# Patient Record
Sex: Female | Born: 1965 | Race: Black or African American | Hispanic: No | Marital: Married | Smoking: Never smoker
Health system: Southern US, Community
[De-identification: ages and names within clinical notes are randomized; demographics above are authoritative.]

## PROBLEM LIST (undated history)

## (undated) DIAGNOSIS — R7303 Prediabetes: Secondary | ICD-10-CM

## (undated) DIAGNOSIS — I10 Essential (primary) hypertension: Secondary | ICD-10-CM

## (undated) DIAGNOSIS — G4700 Insomnia, unspecified: Secondary | ICD-10-CM

## (undated) DIAGNOSIS — E785 Hyperlipidemia, unspecified: Secondary | ICD-10-CM

## (undated) DIAGNOSIS — M5441 Lumbago with sciatica, right side: Secondary | ICD-10-CM

## (undated) DIAGNOSIS — G8929 Other chronic pain: Secondary | ICD-10-CM

## (undated) DIAGNOSIS — M5442 Lumbago with sciatica, left side: Secondary | ICD-10-CM

## (undated) DIAGNOSIS — D219 Benign neoplasm of connective and other soft tissue, unspecified: Secondary | ICD-10-CM

## (undated) HISTORY — DX: Prediabetes: R73.03

## (undated) HISTORY — PX: ABDOMINAL SURGERY: SHX537

## (undated) HISTORY — PX: OTHER SURGICAL HISTORY: SHX169

## (undated) HISTORY — DX: Morbid (severe) obesity due to excess calories: E66.01

## (undated) HISTORY — DX: Lumbago with sciatica, right side: M54.41

## (undated) HISTORY — DX: Hyperlipidemia, unspecified: E78.5

## (undated) HISTORY — DX: Insomnia, unspecified: G47.00

## (undated) HISTORY — DX: Other chronic pain: G89.29

## (undated) HISTORY — DX: Lumbago with sciatica, left side: M54.42

## (undated) HISTORY — DX: Benign neoplasm of connective and other soft tissue, unspecified: D21.9

---

## 1998-07-10 ENCOUNTER — Other Ambulatory Visit: Admission: RE | Admit: 1998-07-10 | Discharge: 1998-07-10 | Payer: Self-pay | Admitting: Family Medicine

## 1999-02-18 ENCOUNTER — Inpatient Hospital Stay (HOSPITAL_COMMUNITY): Admission: AD | Admit: 1999-02-18 | Discharge: 1999-02-18 | Payer: Self-pay | Admitting: Obstetrics

## 2016-12-09 ENCOUNTER — Encounter (HOSPITAL_COMMUNITY): Payer: Self-pay | Admitting: Emergency Medicine

## 2016-12-09 ENCOUNTER — Emergency Department (HOSPITAL_COMMUNITY)
Admission: EM | Admit: 2016-12-09 | Discharge: 2016-12-09 | Disposition: A | Discharge status: Home / Self Care | Payer: Medicaid Other | Attending: Emergency Medicine | Admitting: Emergency Medicine

## 2016-12-09 DIAGNOSIS — Z9889 Other specified postprocedural states: Secondary | ICD-10-CM | POA: Insufficient documentation

## 2016-12-09 DIAGNOSIS — Z5189 Encounter for other specified aftercare: Secondary | ICD-10-CM

## 2016-12-09 DIAGNOSIS — G8918 Other acute postprocedural pain: Secondary | ICD-10-CM | POA: Diagnosis not present

## 2016-12-09 DIAGNOSIS — R5082 Postprocedural fever: Secondary | ICD-10-CM | POA: Insufficient documentation

## 2016-12-09 DIAGNOSIS — R109 Unspecified abdominal pain: Secondary | ICD-10-CM | POA: Insufficient documentation

## 2016-12-09 DIAGNOSIS — I10 Essential (primary) hypertension: Secondary | ICD-10-CM | POA: Diagnosis not present

## 2016-12-09 HISTORY — DX: Essential (primary) hypertension: I10

## 2016-12-09 LAB — CBC
HCT: 27.1 % — ABNORMAL LOW (ref 36.0–46.0)
Hemoglobin: 9.4 g/dL — ABNORMAL LOW (ref 12.0–15.0)
MCH: 30.1 pg (ref 26.0–34.0)
MCHC: 34.7 g/dL (ref 30.0–36.0)
MCV: 86.9 fL (ref 78.0–100.0)
PLATELETS: 543 10*3/uL — AB (ref 150–400)
RBC: 3.12 MIL/uL — ABNORMAL LOW (ref 3.87–5.11)
RDW: 15 % (ref 11.5–15.5)
WBC: 12.8 10*3/uL — ABNORMAL HIGH (ref 4.0–10.5)

## 2016-12-09 LAB — URINALYSIS, ROUTINE W REFLEX MICROSCOPIC
Bilirubin Urine: NEGATIVE
GLUCOSE, UA: NEGATIVE mg/dL
KETONES UR: 20 mg/dL — AB
Leukocytes, UA: NEGATIVE
Nitrite: NEGATIVE
PROTEIN: 30 mg/dL — AB
Specific Gravity, Urine: 1.011 (ref 1.005–1.030)
pH: 6 (ref 5.0–8.0)

## 2016-12-09 LAB — BASIC METABOLIC PANEL
ANION GAP: 11 (ref 5–15)
BUN: 7 mg/dL (ref 6–20)
CALCIUM: 8.6 mg/dL — AB (ref 8.9–10.3)
CO2: 24 mmol/L (ref 22–32)
CREATININE: 0.87 mg/dL (ref 0.44–1.00)
Chloride: 95 mmol/L — ABNORMAL LOW (ref 101–111)
GFR calc Af Amer: >60 mL/min (ref >60)
GLUCOSE: 97 mg/dL (ref 65–99)
Potassium: 3.1 mmol/L — ABNORMAL LOW (ref 3.5–5.1)
Sodium: 130 mmol/L — ABNORMAL LOW (ref 135–145)

## 2016-12-09 NOTE — ED Triage Notes (Addendum)
Per pt, states she had tummy tuck on the 1st of December-states started having hematuria and vaginal swelling/discharge 4 days after-saw PCP yesterday and was evaluated-sent home-here for second opinion-placed on Bactrim for UTI

## 2016-12-09 NOTE — ED Notes (Signed)
Unable to obtain vital signs due patient was in a rush to get to appointment with surgeon, which was arranged by MD visit.

## 2016-12-09 NOTE — ED Notes (Signed)
Patient is A & O x4.  She was ambulatory.  Understood discharge instructions.

## 2016-12-09 NOTE — ED Provider Notes (Signed)
[facility] DEPT Provider Note   CSN: [id number] Arrival date & time: 12/09/16  1020     History   Chief Complaint Chief Complaint  Patient presents with  . post op problems    HPI Kelly Combs is a 50 y.o. female.  Patient status post tummy tuck plastic surgery procedure on December 1. Followed up with plastic surgeon September 13. Since that time patient states wounds been getting redder and harder as been increased pain in the area. Also talking about some drainage from the area where the drain tube was. Patient does states she fell and she's had fever and chills on and off for the past several days. Now has blood in her urine. Procedure by Dr. Sherri Sear      Past Medical History:  Diagnosis Date  . Hypertension     There are no active problems to display for this patient.   Past Surgical History:  Procedure Laterality Date  . ABDOMINAL SURGERY      OB History    No data available       Home Medications    Prior to Admission medications   Medication Sig Start Date End Date Taking? Authorizing Provider  naproxen sodium (ANAPROX) 220 MG tablet Take 220 mg by mouth 2 (two) times daily with a meal.   Yes Historical Provider, MD  Oxycodone HCl 10 MG TABS Take 10 mg by mouth 3 (three) times daily as needed.   Yes Historical Provider, MD  sulfamethoxazole-trimethoprim (BACTRIM DS,SEPTRA DS) 800-160 MG tablet Take 1 tablet by mouth 2 (two) times daily.   Yes Historical Provider, MD    Family History No family history on file.  Social History Social History  Substance Use Topics  . Smoking status: Never Smoker  . Smokeless tobacco: Not on file  . Alcohol use No     Allergies   Patient has no known allergies.   Review of Systems Review of Systems  Constitutional: Positive for chills and fever.  HENT: Negative for congestion.   Eyes: Negative for redness.  Respiratory: Negative for shortness of breath.   Cardiovascular: Negative for chest pain.    Gastrointestinal: Positive for abdominal pain.  Genitourinary: Positive for hematuria.  Musculoskeletal: Positive for back pain.  Skin: Positive for wound.  Neurological: Negative for headaches.  Hematological: Does not bruise/bleed easily.  Psychiatric/Behavioral: Negative for confusion.     Physical Exam Updated Vital Signs BP 111/76 (BP Location: Left Arm)   Pulse (!) 126   Temp 98.6 F (37 C) (Oral)   Resp 18   SpO2 94%   Physical Exam  Constitutional: She is oriented to person, place, and time. She appears well-developed and well-nourished. No distress.  HENT:  Head: Normocephalic and atraumatic.  Mouth/Throat: Oropharynx is clear and moist.  Eyes: Conjunctivae and EOM are normal. Pupils are equal, round, and reactive to light.  Neck: Normal range of motion. Neck supple.  Cardiovascular: Normal rate.   Pulmonary/Chest: Effort normal and breath sounds normal.  Abdominal: Bowel sounds are normal. There is tenderness.  Patient status post plastic surgery tummy tuck incision lower part of the abdomen. Wound closed but skin very erythematous in a band of at least about 7 cm total in with encompassing the entire wound left right. Very indurated. Including redness and induration into the suprapubic pons area. Increased warmth to the area.  Neurological: She is alert and oriented to person, place, and time. No cranial nerve deficit.  Skin: Skin is warm.  Nursing note and  vitals reviewed.    ED Treatments / Results  Labs (all labs ordered are listed, but only abnormal results are displayed) Labs Reviewed  URINALYSIS, ROUTINE W REFLEX MICROSCOPIC - Abnormal; Notable for the following:       Result Value   Hgb urine dipstick MODERATE (*)    Ketones, ur 20 (*)    Protein, ur 30 (*)    Bacteria, UA FEW (*)    Squamous Epithelial / LPF 0-5 (*)    All other components within normal limits  BASIC METABOLIC PANEL - Abnormal; Notable for the following:    Sodium 130 (*)     Potassium 3.1 (*)    Chloride 95 (*)    Calcium 8.6 (*)    All other components within normal limits  CBC - Abnormal; Notable for the following:    WBC 12.8 (*)    RBC 3.12 (*)    Hemoglobin 9.4 (*)    HCT 27.1 (*)    Platelets 543 (*)    All other components within normal limits  URINE CULTURE   Results for orders placed or performed during the hospital encounter of 12/09/16  Urinalysis, Routine w reflex microscopic- may I&O cath if menses  Result Value Ref Range   Color, Urine YELLOW YELLOW   APPearance CLEAR CLEAR   Specific Gravity, Urine 1.011 1.005 - 1.030   pH 6.0 5.0 - 8.0   Glucose, UA NEGATIVE NEGATIVE mg/dL   Hgb urine dipstick MODERATE (A) NEGATIVE   Bilirubin Urine NEGATIVE NEGATIVE   Ketones, ur 20 (A) NEGATIVE mg/dL   Protein, ur 30 (A) NEGATIVE mg/dL   Nitrite NEGATIVE NEGATIVE   Leukocytes, UA NEGATIVE NEGATIVE   RBC / HPF 0-5 0 - 5 RBC/hpf   WBC, UA 6-30 0 - 5 WBC/hpf   Bacteria, UA FEW (A) NONE SEEN   Squamous Epithelial / LPF 0-5 (A) NONE SEEN   Mucous PRESENT   Basic metabolic panel  Result Value Ref Range   Sodium 130 (L) 135 - 145 mmol/L   Potassium 3.1 (L) 3.5 - 5.1 mmol/L   Chloride 95 (L) 101 - 111 mmol/L   CO2 24 22 - 32 mmol/L   Glucose, Bld 97 65 - 99 mg/dL   BUN 7 6 - 20 mg/dL   Creatinine, Ser 0.87 0.44 - 1.00 mg/dL   Calcium 8.6 (L) 8.9 - 10.3 mg/dL   GFR calc non Af Amer >60 >60 mL/min   GFR calc Af Amer >60 >60 mL/min   Anion gap 11 5 - 15  CBC  Result Value Ref Range   WBC 12.8 (H) 4.0 - 10.5 K/uL   RBC 3.12 (L) 3.87 - 5.11 MIL/uL   Hemoglobin 9.4 (L) 12.0 - 15.0 g/dL   HCT 27.1 (L) 36.0 - 46.0 %   MCV 86.9 78.0 - 100.0 fL   MCH 30.1 26.0 - 34.0 pg   MCHC 34.7 30.0 - 36.0 g/dL   RDW 15.0 11.5 - 15.5 %   Platelets 543 (H) 150 - 400 K/uL     EKG  EKG Interpretation None       Radiology No results found.  Procedures Procedures (including critical care time)  Medications Ordered in ED Medications - No data to  display   Initial Impression / Assessment and Plan / ED Course  I have reviewed the triage vital signs and the nursing notes.  Pertinent labs & imaging results that were available during my care of the patient were reviewed by  me and considered in my medical decision making (see chart for details).  Clinical Course     Patients of tummy tuck abdominal wound very concerning for at least a cellulitis. Discussed with her plastic surgeon who will see her in the office. No fever here but patient surgically talked about fever chills at home. White blood cell count slightly elevated at 12,000. Patient also with blood in the urine C urinalysis sent. Plastic surgeon aware that urine is still pending. He will follow her up. Patient discharged to his office for wound check. Vital signs stable other than a heart rate of 126 no hypotension. Patient clinically nontoxic no acute distress.  Addendum patient's basic urinalysis is back. No significant hematuria. Also not consistent with significant urinary tract infection.  Final Clinical Impressions(s) / ED Diagnoses   Final diagnoses:  Visit for wound check    New Prescriptions New Prescriptions   No medications on file     Fredia Sorrow, MD 12/09/16 1152

## 2016-12-09 NOTE — Discharge Instructions (Signed)
Urine not sent to lab here along with culture. Plastic surgery wants to see in the office go directly from here to there.

## 2016-12-09 NOTE — ED Notes (Signed)
Bed: WA05 Expected date:  Expected time:  Means of arrival:  Comments: 

## 2016-12-09 NOTE — ED Notes (Signed)
Bed: WA07 Expected date:  Expected time:  Means of arrival:  Comments: 

## 2016-12-10 LAB — URINE CULTURE: Culture: <10000 — AB

## 2017-05-04 ENCOUNTER — Other Ambulatory Visit: Payer: Self-pay | Admitting: Neurological Surgery

## 2017-05-04 DIAGNOSIS — M545 Low back pain: Secondary | ICD-10-CM

## 2017-05-18 ENCOUNTER — Ambulatory Visit
Admission: RE | Admit: 2017-05-18 | Discharge: 2017-05-18 | Disposition: A | Discharge status: Home / Self Care | Payer: Medicaid Other | Source: Ambulatory Visit | Attending: Neurological Surgery | Admitting: Neurological Surgery

## 2017-05-18 DIAGNOSIS — M545 Low back pain: Secondary | ICD-10-CM

## 2018-11-14 ENCOUNTER — Other Ambulatory Visit: Payer: Self-pay | Admitting: Nurse Practitioner

## 2018-11-14 DIAGNOSIS — Z1231 Encounter for screening mammogram for malignant neoplasm of breast: Secondary | ICD-10-CM

## 2019-03-21 ENCOUNTER — Ambulatory Visit: Payer: Self-pay

## 2019-05-01 ENCOUNTER — Ambulatory Visit
Admission: RE | Admit: 2019-05-01 | Discharge: 2019-05-01 | Disposition: A | Discharge status: Home / Self Care | Payer: Medicaid Other | Source: Ambulatory Visit | Attending: Nurse Practitioner | Admitting: Nurse Practitioner

## 2019-05-01 ENCOUNTER — Other Ambulatory Visit: Payer: Self-pay

## 2019-05-01 ENCOUNTER — Other Ambulatory Visit: Payer: Self-pay | Admitting: Internal Medicine

## 2019-05-01 DIAGNOSIS — Z1231 Encounter for screening mammogram for malignant neoplasm of breast: Secondary | ICD-10-CM

## 2019-05-04 ENCOUNTER — Ambulatory Visit (HOSPITAL_COMMUNITY)
Admission: EM | Admit: 2019-05-04 | Discharge: 2019-05-04 | Disposition: A | Discharge status: Home / Self Care | Payer: Medicaid Other | Attending: Family Medicine | Admitting: Family Medicine

## 2019-05-04 ENCOUNTER — Encounter (HOSPITAL_COMMUNITY): Payer: Self-pay

## 2019-05-04 DIAGNOSIS — S61211A Laceration without foreign body of left index finger without damage to nail, initial encounter: Secondary | ICD-10-CM | POA: Diagnosis not present

## 2019-05-04 DIAGNOSIS — W260XXA Contact with knife, initial encounter: Secondary | ICD-10-CM

## 2019-05-04 NOTE — ED Provider Notes (Signed)
[facility]    CSN: [id number] Arrival date & time: 05/04/19  [place]     History   Chief Complaint Chief Complaint  Patient presents with  . Laceration    HPI Kelly Combs is a 53 y.o. female history of hypertension presenting today for evaluation of finger laceration.  Yesterday evening she was cutting a bun with a knife.  She accidentally cut her left index finger.  She has cleaned it with alcohol and peroxide.  Today while cleaning the bleeding began again which caused her to have this evaluated today.  Tetanus was updated approximately 3 years ago.  Denies numbness or tingling.  Denies difficulty bending finger.  HPI  Past Medical History:  Diagnosis Date  . Hypertension     There are no active problems to display for this patient.   Past Surgical History:  Procedure Laterality Date  . ABDOMINAL SURGERY      OB History   No obstetric history on file.      Home Medications    Prior to Admission medications   Medication Sig Start Date End Date Taking? Authorizing Provider  naproxen sodium (ANAPROX) 220 MG tablet Take 220 mg by mouth 2 (two) times daily with a meal.    [provider]  Oxycodone HCl 10 MG TABS Take 10 mg by mouth 3 (three) times daily as needed.    [provider]  sulfamethoxazole-trimethoprim (BACTRIM DS,SEPTRA DS) 800-160 MG tablet Take 1 tablet by mouth 2 (two) times daily.    [provider]    Family History Family History  Problem Relation Age of Onset  . Breast cancer Neg Hx     Social History Social History   Tobacco Use  . Smoking status: Never Smoker  . Smokeless tobacco: Never Used  Substance Use Topics  . Alcohol use: No  . Drug use: Not on file     Allergies   Patient has no known allergies.   Review of Systems Review of Systems  Constitutional: Negative for fatigue and fever.  Eyes: Negative for visual disturbance.  Respiratory: Negative for shortness of breath.    Cardiovascular: Negative for chest pain.  Gastrointestinal: Negative for abdominal pain, nausea and vomiting.  Musculoskeletal: Negative for arthralgias and joint swelling.  Skin: Positive for wound. Negative for color change and rash.  Neurological: Negative for dizziness, weakness, light-headedness and headaches.     Physical Exam Triage Vital Signs ED Triage Vitals  Enc Vitals Group     BP 05/04/19 1645 120/81     Pulse Rate 05/04/19 1645 95     Resp 05/04/19 1645 18     Temp 05/04/19 1645 98.1 F (36.7 C)     Temp Source 05/04/19 1645 Oral     SpO2 05/04/19 1645 98 %     Weight --      Height --      Head Circumference --      Peak Flow --      Pain Score 05/04/19 1647 1     Pain Loc --      Pain Edu? --      Excl. in [place]? --    No data found.  Updated Vital Signs BP 120/81 (BP Location: Right Arm)   Pulse 95   Temp 98.1 F (36.7 C) (Oral)   Resp 18   SpO2 98%   Visual Acuity Right Eye Distance:   Left Eye Distance:   Bilateral Distance:    Right Eye Near:  Left Eye Near:    Bilateral Near:     Physical Exam Vitals signs and nursing note reviewed.  Constitutional:      Appearance: She is well-developed.     Comments: No acute distress  HENT:     Head: Normocephalic and atraumatic.     Nose: Nose normal.  Eyes:     Conjunctiva/sclera: Conjunctivae normal.  Neck:     Musculoskeletal: Neck supple.  Cardiovascular:     Rate and Rhythm: Normal rate.  Pulmonary:     Effort: Pulmonary effort is normal. No respiratory distress.  Abdominal:     General: There is no distension.  Musculoskeletal: Normal range of motion.     Comments: Full active range of motion of left index finger at DIP and PIP, sensation intact distally, cap refill less than 2 seconds  Skin:    General: Skin is warm and dry.     Comments: 1 cm superficial laceration to left index finger palmar surface along distal phalange, approximately 1 to 2 mm of separation between wound edges   Neurological:     Mental Status: She is alert and oriented to person, place, and time.      UC Treatments / Results  Labs (all labs ordered are listed, but only abnormal results are displayed) Labs Reviewed - No data to display  EKG None  Radiology No results found.  Procedures Laceration Repair Date/Time: 05/04/2019 5:26 PM Performed by: Chosen Garron, Elesa Hacker, PA-C Authorized by: Raylene Everts, MD   Consent:    Consent obtained:  Verbal   Consent given by:  Patient   Risks discussed:  Infection   Alternatives discussed:  No treatment Anesthesia (see MAR for exact dosages):    Anesthesia method:  None Laceration details:    Location:  Finger   Finger location:  L index finger   Length (cm):  1   Depth (mm):  2 Repair type:    Repair type:  Simple Pre-procedure details:    Preparation:  Patient was prepped and draped in usual sterile fashion Exploration:    Hemostasis achieved with:  Direct pressure   Wound exploration: wound explored through full range of motion     Wound extent: no foreign bodies/material noted, no muscle damage noted, no tendon damage noted and no underlying fracture noted     Contaminated: no   Treatment:    Area cleansed with:  Soap and water   Amount of cleaning:  Standard   Irrigation solution:  Sterile water   Irrigation volume:  250   Irrigation method:  Syringe   Visualized foreign bodies/material removed: no   Skin repair:    Repair method:  Tissue adhesive Approximation:    Approximation:  Loose Post-procedure details:    Dressing:  Open (no dressing)   Patient tolerance of procedure:  Tolerated well, no immediate complications   (including critical care time)  Medications Ordered in UC Medications - No data to display  Initial Impression / Assessment and Plan / UC Course  I have reviewed the triage vital signs and the nursing notes.  Pertinent labs & imaging results that were available during my care of the patient were  reviewed by me and considered in my medical decision making (see chart for details).    Tetanus up-to-date. Neurovascularly intact.  Wound reapproximated with Dermabond, irrigated and cleaned well prior to closure.  Discussed monitoring for signs of infection.  Avoid further irritation to the tip of finger.  Discussed wound care,Discussed  strict return precautions. Patient verbalized understanding and is agreeable with plan.  Final Clinical Impressions(s) / UC Diagnoses   Final diagnoses:  Laceration of left index finger without damage to nail, foreign body presence unspecified, initial encounter     Discharge Instructions     [place] for infection- increased redness, swelling, pain or drainage from wound return for recheck Keep clean and dry Skin glue will come off on it own Avoid addition injury/irriataion to finger to avoid delaying healing   ED Prescriptions    None     Controlled Substance Prescriptions [place] Controlled Substance Registry consulted? Not Applicable   Janith Lima, [place] 05/04/19 1728

## 2019-05-04 NOTE — ED Notes (Signed)
Patient verbalizes understanding of discharge instructions. Opportunity for questioning and answers were provided. Patient discharged from [facility] by provider.  

## 2019-05-04 NOTE — Discharge Instructions (Signed)
[place] for infection- increased redness, swelling, pain or drainage from wound return for recheck Keep clean and dry Skin glue will come off on it own Avoid addition injury/irriataion to finger to avoid delaying healing

## 2019-05-04 NOTE — ED Triage Notes (Signed)
Pt state cut her lt 2nd digit with a knife yesterday. States today was cleaning it and started bleeding again. Bleeding controlled at this time.

## 2019-05-07 ENCOUNTER — Ambulatory Visit: Payer: Self-pay

## 2019-05-09 ENCOUNTER — Other Ambulatory Visit: Payer: Self-pay | Admitting: Internal Medicine

## 2019-05-09 DIAGNOSIS — R928 Other abnormal and inconclusive findings on diagnostic imaging of breast: Secondary | ICD-10-CM

## 2019-05-11 ENCOUNTER — Other Ambulatory Visit: Payer: Self-pay | Admitting: Family Medicine

## 2019-05-11 ENCOUNTER — Other Ambulatory Visit: Payer: Self-pay | Admitting: Internal Medicine

## 2019-05-11 DIAGNOSIS — R928 Other abnormal and inconclusive findings on diagnostic imaging of breast: Secondary | ICD-10-CM

## 2019-05-15 ENCOUNTER — Ambulatory Visit
Admission: RE | Admit: 2019-05-15 | Discharge: 2019-05-15 | Disposition: A | Discharge status: Home / Self Care | Payer: Medicaid Other | Source: Ambulatory Visit | Attending: Internal Medicine | Admitting: Internal Medicine

## 2019-05-15 ENCOUNTER — Other Ambulatory Visit: Payer: Self-pay

## 2019-05-15 DIAGNOSIS — R928 Other abnormal and inconclusive findings on diagnostic imaging of breast: Secondary | ICD-10-CM

## 2019-07-31 ENCOUNTER — Other Ambulatory Visit: Payer: Self-pay

## 2019-07-31 ENCOUNTER — Encounter (HOSPITAL_COMMUNITY): Payer: Self-pay | Admitting: Emergency Medicine

## 2019-07-31 ENCOUNTER — Ambulatory Visit (HOSPITAL_COMMUNITY)
Admission: EM | Admit: 2019-07-31 | Discharge: 2019-07-31 | Disposition: A | Discharge status: Home / Self Care | Payer: Medicaid Other | Attending: Family Medicine | Admitting: Family Medicine

## 2019-07-31 DIAGNOSIS — R0602 Shortness of breath: Secondary | ICD-10-CM | POA: Insufficient documentation

## 2019-07-31 DIAGNOSIS — Z20828 Contact with and (suspected) exposure to other viral communicable diseases: Secondary | ICD-10-CM | POA: Diagnosis not present

## 2019-07-31 DIAGNOSIS — E785 Hyperlipidemia, unspecified: Secondary | ICD-10-CM | POA: Diagnosis not present

## 2019-07-31 DIAGNOSIS — R030 Elevated blood-pressure reading, without diagnosis of hypertension: Secondary | ICD-10-CM | POA: Diagnosis not present

## 2019-07-31 DIAGNOSIS — R0982 Postnasal drip: Secondary | ICD-10-CM | POA: Diagnosis not present

## 2019-07-31 DIAGNOSIS — R070 Pain in throat: Secondary | ICD-10-CM | POA: Insufficient documentation

## 2019-07-31 DIAGNOSIS — I10 Essential (primary) hypertension: Secondary | ICD-10-CM | POA: Insufficient documentation

## 2019-07-31 LAB — POCT RAPID STREP A: Streptococcus, Group A Screen (Direct): NEGATIVE

## 2019-07-31 MED ORDER — ALBUTEROL SULFATE HFA 108 (90 BASE) MCG/ACT IN AERS: 1.0000 | INHALATION_SPRAY | Freq: Four times a day (QID) | RESPIRATORY_TRACT | 0 refills | Status: AC | PRN

## 2019-07-31 NOTE — ED Triage Notes (Signed)
Pt here for covid testing after returning from [place]; pt sts some SOB

## 2019-07-31 NOTE — Discharge Instructions (Addendum)
We will manage this as a viral syndrome. For sore throat or cough try using a honey-based tea. Use 3 teaspoons of honey with juice squeezed from half lemon. Place shaved pieces of ginger into 1/2-1 cup of water and warm over stove top. Then mix the ingredients and repeat every 4 hours as needed. Please take Tylenol 500mg  every 6 hours. Hydrate very well with at least 2 liters of water. Eat light meals such as soups to replenish electrolytes and soft fruits, veggies. Start an antihistamine like Zyrtec, Allegra or Claritin for postnasal drainage, sinus congestion.  You can take this together with pseudoephedrine (Sudafed) at a dose of 60 mg 3 times a day as needed for the same kind of congestion.  However, do not take Sudafed if you have high blood pressure or are prone to palpitations, have abnormal heart rhythms.

## 2019-07-31 NOTE — ED Provider Notes (Addendum)
MRN: [id number] DOB: 1966/08/01  Subjective:   Kelly Combs is a 53 y.o. female presenting for 1 week history of mild-moderate shob, 1 day history of sore throat, lymph node swelling of her neck. Tried drinking some hot tea. Has not tried medications for relief.  No known COVID 19 contacts. Takes losartan and atorvastatin for hypertension and hyperlipidemia, respectively. Denies smoking cigarettes. Of note, patient did just travel to [place].  No known COVID contacts.  Would like to get tested to make sure today.   No Known Allergies   Past Medical History:  Diagnosis Date  . Hypertension      Past Surgical History:  Procedure Laterality Date  . ABDOMINAL SURGERY      Review of Systems  Constitutional: Negative for fever and malaise/fatigue.  HENT: Positive for sore throat. Negative for congestion, ear pain and sinus pain.   Eyes: Negative for blurred vision, double vision, discharge and redness.  Respiratory: Positive for shortness of breath. Negative for cough, hemoptysis and wheezing.   Cardiovascular: Negative for chest pain.  Gastrointestinal: Negative for abdominal pain, diarrhea, nausea and vomiting.  Genitourinary: Negative for dysuria, flank pain and hematuria.  Musculoskeletal: Negative for myalgias.  Skin: Negative for rash.  Neurological: Negative for dizziness, weakness and headaches.  Psychiatric/Behavioral: Negative for depression and substance abuse.    Objective:   Vitals: BP (!) 152/95 (BP Location: Right Arm)   Pulse 81   Temp 97.9 F (36.6 C) (Temporal)   Resp 18   SpO2 98%   Physical Exam Constitutional:      General: She is not in acute distress.    Appearance: Normal appearance. She is well-developed. She is not ill-appearing, toxic-appearing or diaphoretic.  HENT:     Head: Normocephalic and atraumatic.     Right Ear: Tympanic membrane and ear canal normal. No drainage or tenderness. No middle ear effusion. Tympanic membrane is not  erythematous.     Left Ear: Tympanic membrane and ear canal normal. No drainage or tenderness.  No middle ear effusion. Tympanic membrane is not erythematous.     Nose: Nose normal. No congestion or rhinorrhea.     Mouth/Throat:     Mouth: Mucous membranes are moist. No oral lesions.     Pharynx: No pharyngeal swelling, oropharyngeal exudate, posterior oropharyngeal erythema or uvula swelling.     Tonsils: No tonsillar exudate or tonsillar abscesses.     Comments: Sequelae of postnasal drip without tonsillar erythema, swelling, exudate. Eyes:     Extraocular Movements: Extraocular movements intact.     Right eye: Normal extraocular motion.     Left eye: Normal extraocular motion.     Conjunctiva/sclera: Conjunctivae normal.     Pupils: Pupils are equal, round, and reactive to light.  Neck:     Musculoskeletal: Normal range of motion and neck supple.  Cardiovascular:     Rate and Rhythm: Normal rate and regular rhythm.     Pulses: Normal pulses.     Heart sounds: Normal heart sounds. No murmur. No friction rub. No gallop.   Pulmonary:     Effort: Pulmonary effort is normal. No respiratory distress.     Breath sounds: Normal breath sounds. No stridor. No wheezing, rhonchi or rales.  Lymphadenopathy:     Cervical: No cervical adenopathy.  Skin:    General: Skin is warm and dry.     Findings: No rash.  Neurological:     General: No focal deficit present.     Mental Status: She  is alert and oriented to person, place, and time.  Psychiatric:        Mood and Affect: Mood normal.        Behavior: Behavior normal.        Thought Content: Thought content normal.     Results for orders placed or performed during the hospital encounter of 07/31/19 (from the past 24 hour(s))  POCT rapid strep A [facility])     Status: None   Collection Time: 07/31/19  2:09 PM  Result Value Ref Range   Streptococcus, Group A Screen (Direct) NEGATIVE NEGATIVE    Assessment and Plan :   1. Throat  pain   2. Shortness of breath   3. Post-nasal drainage   4. Essential hypertension   5. Elevated blood pressure reading     Likely viral in etiology. Counseled patient on nature of COVID-19 including modes of transmission, diagnostic testing, management and supportive care.  Offered symptomatic relief. COVID 19 testing is pending. Counseled patient on potential for adverse effects with medications prescribed/recommended today, ER and return-to-clinic precautions discussed, patient verbalized understanding.     Jaynee Eagles, PA-C 07/31/19 1425

## 2019-08-03 LAB — CULTURE, GROUP A STREP (THRC)

## 2019-08-03 LAB — NOVEL CORONAVIRUS, NAA (HOSP ORDER, SEND-OUT TO REF LAB; TAT 18-24 HRS): SARS-CoV-2, NAA: NOT DETECTED

## 2019-11-19 ENCOUNTER — Other Ambulatory Visit: Payer: Self-pay

## 2019-11-19 DIAGNOSIS — Z20822 Contact with and (suspected) exposure to covid-19: Secondary | ICD-10-CM

## 2019-11-21 LAB — NOVEL CORONAVIRUS, NAA: SARS-CoV-2, NAA: NOT DETECTED

## 2019-12-05 ENCOUNTER — Other Ambulatory Visit: Payer: Medicaid Other

## 2019-12-26 ENCOUNTER — Other Ambulatory Visit: Payer: Self-pay

## 2019-12-26 DIAGNOSIS — Z20822 Contact with and (suspected) exposure to covid-19: Secondary | ICD-10-CM

## 2019-12-27 LAB — NOVEL CORONAVIRUS, NAA: SARS-CoV-2, NAA: NOT DETECTED

## 2020-02-01 ENCOUNTER — Ambulatory Visit: Payer: Self-pay | Admitting: Obstetrics and Gynecology

## 2020-04-10 ENCOUNTER — Other Ambulatory Visit: Payer: Self-pay | Admitting: Physician Assistant

## 2020-04-10 DIAGNOSIS — Z780 Asymptomatic menopausal state: Secondary | ICD-10-CM

## 2020-04-10 DIAGNOSIS — Z1231 Encounter for screening mammogram for malignant neoplasm of breast: Secondary | ICD-10-CM

## 2020-05-01 ENCOUNTER — Ambulatory Visit: Payer: Self-pay

## 2020-05-08 ENCOUNTER — Ambulatory Visit
Admission: RE | Admit: 2020-05-08 | Discharge: 2020-05-08 | Disposition: A | Discharge status: Home / Self Care | Payer: Managed Care, Other (non HMO) | Source: Ambulatory Visit | Attending: Physician Assistant | Admitting: Physician Assistant

## 2020-05-08 ENCOUNTER — Other Ambulatory Visit: Payer: Self-pay

## 2020-05-08 DIAGNOSIS — Z1231 Encounter for screening mammogram for malignant neoplasm of breast: Secondary | ICD-10-CM

## 2020-05-09 ENCOUNTER — Other Ambulatory Visit (HOSPITAL_COMMUNITY): Payer: Self-pay | Admitting: Obstetrics and Gynecology

## 2020-05-09 DIAGNOSIS — R600 Localized edema: Secondary | ICD-10-CM

## 2020-05-16 ENCOUNTER — Other Ambulatory Visit: Payer: Self-pay

## 2020-05-16 ENCOUNTER — Ambulatory Visit (HOSPITAL_COMMUNITY)
Admission: RE | Admit: 2020-05-16 | Discharge: 2020-05-16 | Disposition: A | Discharge status: Home / Self Care | Payer: Managed Care, Other (non HMO) | Source: Ambulatory Visit | Attending: Obstetrics and Gynecology | Admitting: Obstetrics and Gynecology

## 2020-05-16 DIAGNOSIS — R600 Localized edema: Secondary | ICD-10-CM

## 2020-05-16 NOTE — Progress Notes (Signed)
Bilateral lower extremity venous duplex has been completed. Preliminary results can be found in CV Proc through chart review.  Results were faxed to Dr. Gaetano Net.  05/16/20 9:35 AM Carlos Levering RVT

## 2020-08-25 ENCOUNTER — Emergency Department (HOSPITAL_BASED_OUTPATIENT_CLINIC_OR_DEPARTMENT_OTHER)
Admission: EM | Admit: 2020-08-25 | Discharge: 2020-08-25 | Discharge status: Against Medical Advice | Payer: Managed Care, Other (non HMO)

## 2020-08-28 ENCOUNTER — Other Ambulatory Visit: Payer: Self-pay | Admitting: Pain Medicine

## 2020-08-28 ENCOUNTER — Ambulatory Visit
Admission: RE | Admit: 2020-08-28 | Discharge: 2020-08-28 | Disposition: A | Discharge status: Home / Self Care | Payer: Managed Care, Other (non HMO) | Source: Ambulatory Visit | Attending: Pain Medicine | Admitting: Pain Medicine

## 2020-08-28 DIAGNOSIS — M25561 Pain in right knee: Secondary | ICD-10-CM

## 2020-09-15 ENCOUNTER — Other Ambulatory Visit: Payer: Self-pay

## 2020-09-15 ENCOUNTER — Encounter: Payer: Self-pay | Admitting: Internal Medicine

## 2020-09-15 ENCOUNTER — Ambulatory Visit: Payer: Managed Care, Other (non HMO) | Admitting: Internal Medicine

## 2020-09-15 VITALS — BP 118/63 | HR 79 | Ht 60.0 in | Wt 176.6 lb

## 2020-09-15 DIAGNOSIS — E785 Hyperlipidemia, unspecified: Secondary | ICD-10-CM | POA: Diagnosis not present

## 2020-09-15 DIAGNOSIS — R0602 Shortness of breath: Secondary | ICD-10-CM

## 2020-09-15 DIAGNOSIS — I10 Essential (primary) hypertension: Secondary | ICD-10-CM | POA: Diagnosis not present

## 2020-09-15 DIAGNOSIS — R072 Precordial pain: Secondary | ICD-10-CM

## 2020-09-15 DIAGNOSIS — R079 Chest pain, unspecified: Secondary | ICD-10-CM

## 2020-09-15 MED ORDER — METOPROLOL TARTRATE 100 MG PO TABS: ORAL_TABLET | ORAL | 0 refills | Status: AC

## 2020-09-15 NOTE — Patient Instructions (Addendum)
Medication Instructions:  Your physician recommends that you continue on your current medications as directed. Please refer to the Current Medication list given to you today.  *If you need a refill on your cardiac medications before your next appointment, please call your pharmacy*   Lab Work: None  If you have labs (blood work) drawn today and your tests are completely normal, you will receive your results only by: Marland Kitchen MyChart Message (if you have MyChart) OR . A paper copy in the mail If you have any lab test that is abnormal or we need to change your treatment, we will call you to review the results.   Testing/Procedures: Your physician has requested that you have cardiac CT.    Follow-Up: At [facility], you and your health needs are our priority.  As part of our continuing mission to provide you with exceptional heart care, we have created designated Provider Care Teams.  These Care Teams include your primary Cardiologist (physician) and Advanced Practice Providers (APPs -  Physician Assistants and Nurse Practitioners) who all work together to provide you with the care you need, when you need it.  We recommend signing up for the patient portal called "MyChart".  Sign up information is provided on this After Visit Summary.  MyChart is used to connect with patients for Virtual Visits (Telemedicine).  Patients are able to view lab/test results, encounter notes, upcoming appointments, etc.  Non-urgent messages can be sent to your provider as well.   To learn more about what you can do with MyChart, go to [email or web address].    Your next appointment:   3-4 month(s)  The format for your next appointment:   In Person  Provider:   Rudean Haskell, MD   Other Instructions Your cardiac CT will be scheduled at one of the below locations:   [facility] [address] [place], [place] [address] [phone]  [place] [address] [place],  [address] [phone]  If scheduled at [facility], please arrive at the [facility] main entrance of [facility] 30 minutes prior to test start time. Proceed to the [facility] Radiology Department (first floor) to check-in and test prep.  If scheduled at [facility], please arrive 15 mins early for check-in and test prep.  Please follow these instructions carefully (unless otherwise directed):   On the Night Before the Test: . Be sure to Drink plenty of water. . Do not consume any caffeinated/decaffeinated beverages or chocolate 12 hours prior to your test. . Do not take any antihistamines 12 hours prior to your test.   On the Day of the Test: . Drink plenty of water. Do not drink any water within one hour of the test. . Do not eat any food 4 hours prior to the test. . You may take your regular medications prior to the test.  . Take metoprolol (Lopressor) 100 MG two hours prior to test. . HOLD Losartan-Hydrochlorothiazide morning of the test. . FEMALES- please wear underwire-free bra if available     After the Test: . Drink plenty of water. . After receiving IV contrast, you may experience a mild flushed feeling. This is normal. . On occasion, you may experience a mild rash up to 24 hours after the test. This is not dangerous. If this occurs, you can take Benadryl 25 mg and increase your fluid intake. . If you experience trouble breathing, this can be serious.  If it is severe call 911 IMMEDIATELY. If it is mild, please call our office.   Once we have confirmed authorization from your insurance company, we will call you to set up a date and time for your test. Based on how quickly your insurance processes prior authorizations requests, please allow up to 4 weeks to be contacted for scheduling your Cardiac CT appointment. Be advised that routine Cardiac CT appointments could be scheduled as many  as 8 weeks after your provider has ordered it.  For non-scheduling related questions, please contact the cardiac imaging nurse navigator should you have any questions/concerns: Marchia Bond, Cardiac Imaging Nurse Navigator Burley Saver, Interim Cardiac Imaging Nurse [place] [facility] Direct Office Dial: [phone]   For scheduling needs, including cancellations and rescheduling, please call Vivien Rota at [phone], option 3.

## 2020-09-15 NOTE — Progress Notes (Signed)
Cardiology Office Note:    Date:  09/15/2020   ID:  Kelly Combs, DOB 10-13-1966, MRN [id number]  PCP:  [facility] [place] Cardiologist:  No primary care provider on file.  [facility] Electrophysiologist:  None   Referring MD: Kelly Mayhew, MD   CC: Shortness of breath Consulted for the evaluation of primary prevention at the behest of [place], [place] [facility] [place]  History of Present Illness:    Kelly Combs is a 54 y.o. female with a hx of HTN, HLD on atorvastatin (LDL 153 prior), Pre-diabetes Morbid Obesity who presents for primary prevention visit.  Patient notes shortness of breath with chest pain.  Patient notes that she gets chest pain at rest or at any time.  Chest pain feels like chest pressure; sub sternal no radiation.  Worse with exertion (light walking or exertion).  Often feels the shortness of breath with the chest pain.  Also occurs SOB with even lighter activity.  Feels the shortness of breath at rest; at least daily.  No Othopnea; no weight gain.  No PND.  Notes Bendopnea.  No syncope   Ambulatory blood pressure .No prior stress tests, no cardiac catheterization.  Past Medical History:  Diagnosis Date  . Fibroids   . Hyperlipidemia   . Hypertension   . Insomnia   . Lumbago with sciatica, left side   . Lumbago with sciatica, right side   . Morbid obesity ([place])   . Other chronic pain   . Prediabetes     Past Surgical History:  Procedure Laterality Date  . ABDOMINAL SURGERY      Current Medications: Current Meds  Medication Sig  . albuterol (VENTOLIN HFA) 108 (90 Base) MCG/ACT inhaler Inhale 1-2 puffs into the lungs every 6 (six) hours as needed for wheezing or shortness of breath.  Marland Kitchen atorvastatin (LIPITOR) 10 MG tablet Take 10 mg by mouth daily.  . Liraglutide -Weight Management (SAXENDA) 18 MG/3ML SOPN Inject into the skin.  Marland Kitchen losartan-hydrochlorothiazide (HYZAAR) 50-12.5 MG tablet Take 1  tablet by mouth daily.  Marland Kitchen oxyCODONE-acetaminophen (PERCOCET) 10-325 MG tablet Take 1 tablet by mouth every 4 (four) hours as needed for pain.  Marland Kitchen zolpidem (AMBIEN) 10 MG tablet Take 10 mg by mouth at bedtime as needed for sleep.     Allergies:   Patient has no known allergies.   Social History   Socioeconomic History  . Marital status: Married    Spouse name: Not on file  . Number of children: Not on file  . Years of education: Not on file  . Highest education level: Not on file  Occupational History  . Not on file  Tobacco Use  . Smoking status: Never Smoker  . Smokeless tobacco: Never Used  Substance and Sexual Activity  . Alcohol use: No  . Drug use: Not on file  . Sexual activity: Not on file  Other Topics Concern  . Not on file  Social History Narrative  . Not on file   Social Determinants of Health   Financial Resource Strain:   . Difficulty of Paying Living Expenses: Not on file  Food Insecurity:   . Worried About Charity fundraiser in the Last Year: Not on file  . Ran Out of Food in the Last Year: Not on file  Transportation Needs:   . Lack of Transportation (Medical): Not on file  . Lack of Transportation (Non-Medical): Not on file  Physical Activity:   .  Days of Exercise per Week: Not on file  . Minutes of Exercise per Session: Not on file  Stress:   . Feeling of Stress : Not on file  Social Connections:   . Frequency of Communication with Friends and Family: Not on file  . Frequency of Social Gatherings with Friends and Family: Not on file  . Attends Religious Services: Not on file  . Active Member of Clubs or Organizations: Not on file  . Attends Archivist Meetings: Not on file  . Marital Status: Not on file  Works for [employer]; recent promotion.   Family History: The patient's family history is negative for Breast cancer. Dad died of CHF and had MI - in diabetic Grandfather MI. Aunt- MI.  ROS:   Please see the history of present  illness.    All other systems reviewed and are negative.  EKGs/Labs/Other Studies Reviewed:    The following studies were reviewed today:  EKG:  EKG is ordered today.  The ekg ordered today demonstrates sinus 79 with LVH without TWI or ST changes. 07/21/20 GFR 106 Creatinine 0.75 5.8% A1c  Physical Exam:    VS:  BP 118/63   Pulse 79   Ht 5' (1.524 m)   Wt 176 lb 9.6 oz (80.1 kg)   SpO2 97%   BMI 34.49 kg/m     Wt Readings from Last 3 Encounters:  09/15/20 176 lb 9.6 oz (80.1 kg)     GEN: Obese female well developed in no acute distress HEENT: Normal NECK: No JVD; No carotid bruits LYMPHATICS: No lymphadenopathy CARDIAC: RRR, no murmurs, rubs, gallops RESPIRATORY:  Clear to auscultation without rales, wheezing or rhonchi  ABDOMEN: Soft, non-tender, non-distended MUSCULOSKELETAL:  No edema; No deformity  SKIN: Warm and dry NEUROLOGIC:  Alert and oriented x 3 PSYCHIATRIC:  Normal affect   ASSESSMENT:    1. Chest pain of uncertain etiology   2. Essential hypertension   3. Hyperlipidemia, unspecified hyperlipidemia type   4. Shortness of breath    PLAN:    In order of problems listed above:  Chest pain with shortness of breath - SHARED DECISION MAKING Given family history of early coronary artery disease with chest pressure brought on by exertion and improved with rest, with HLD and HTN; offered both NM Exercise Stress vs CCTA +/- FFR.  Patient unable to do exercise/quarantine protocl; will do CCTA - if no evidence of CAD; will get echo Essential Hypertension - continue current medications HLD - will readdress at next visit after defining anatomy  3-4 months to discuss primary and secondary prevention  Medication Adjustments/Labs and Tests Ordered: Current medicines are reviewed at length with the patient today.  Concerns regarding medicines are outlined above.  No orders of the defined types were placed in this encounter.  No orders of the defined types were  placed in this encounter.   There are no Patient Instructions on file for this visit.   Signed, Werner Lean, MD  09/15/2020 3:46 PM    [place]

## 2020-09-22 ENCOUNTER — Telehealth (HOSPITAL_COMMUNITY): Payer: Self-pay | Admitting: Emergency Medicine

## 2020-09-22 NOTE — Telephone Encounter (Signed)
Reaching out to patient to offer assistance regarding upcoming cardiac imaging study; pt verbalizes understanding of appt date/time, parking situation and where to check in, pre-test NPO status and medications ordered, and verified current allergies; name and call back number provided for further questions should they arise Jaylani Mcguinn RN Navigator Cardiac Imaging [facility] [phone] office [phone] cell 

## 2020-09-23 ENCOUNTER — Encounter (HOSPITAL_COMMUNITY): Payer: Self-pay

## 2020-09-23 ENCOUNTER — Ambulatory Visit (HOSPITAL_COMMUNITY)
Admission: RE | Admit: 2020-09-23 | Discharge: 2020-09-23 | Disposition: A | Discharge status: Home / Self Care | Payer: Managed Care, Other (non HMO) | Source: Ambulatory Visit | Attending: Internal Medicine | Admitting: Internal Medicine

## 2020-09-23 DIAGNOSIS — R072 Precordial pain: Secondary | ICD-10-CM

## 2020-09-23 MED ORDER — IOHEXOL 350 MG/ML SOLN
80.0000 mL | Freq: Once | INTRAVENOUS | Status: AC | PRN
  Administered 2020-09-23: 80 mL via INTRAVENOUS

## 2020-09-23 MED ORDER — NITROGLYCERIN 0.4 MG SL SUBL
SUBLINGUAL_TABLET | SUBLINGUAL | Status: AC
  Filled 2020-09-23: qty 2

## 2020-09-23 MED ORDER — METOPROLOL TARTRATE 5 MG/5ML IV SOLN
INTRAVENOUS | Status: AC
  Filled 2020-09-23: qty 10

## 2020-09-23 MED ORDER — NITROGLYCERIN 0.4 MG SL SUBL
0.8000 mg | SUBLINGUAL_TABLET | Freq: Once | SUBLINGUAL | Status: AC
  Administered 2020-09-23: 0.8 mg via SUBLINGUAL

## 2020-09-23 MED ORDER — METOPROLOL TARTRATE 5 MG/5ML IV SOLN: 10.0000 mg | INTRAVENOUS | Status: DC | PRN

## 2020-09-23 NOTE — Discharge Instructions (Signed)
Cardiac CT Angiogram A cardiac CT angiogram is a procedure to look at the heart and the area around the heart. It may be done to help find the cause of chest pains or other symptoms of heart disease. During this procedure, a substance called contrast dye is injected into the blood vessels in the area to be checked. A large X-ray machine, called a CT scanner, then takes detailed pictures of the heart and the surrounding area. The procedure is also sometimes called a coronary CT angiogram, coronary artery scanning, or CTA. A cardiac CT angiogram allows the health care provider to see how well blood is flowing to and from the heart. The health care provider will be able to see if there are any problems, such as:  Blockage or narrowing of the coronary arteries in the heart.  Fluid around the heart.  Signs of weakness or disease in the muscles, valves, and tissues of the heart. Tell a health care provider about:  Any allergies you have. This is especially important if you have had a previous allergic reaction to contrast dye.  All medicines you are taking, including vitamins, herbs, eye drops, creams, and over-the-counter medicines.  Any blood disorders you have.  Any surgeries you have had.  Any medical conditions you have.  Whether you are pregnant or may be pregnant.  Any anxiety disorders, chronic pain, or other conditions you have that may increase your stress or prevent you from lying still. What are the risks? Generally, this is a safe procedure. However, problems may occur, including:  Bleeding.  Infection.  Allergic reactions to medicines or dyes.  Damage to other structures or organs.  Kidney damage from the contrast dye that is used.  Increased risk of cancer from radiation exposure. This risk is low. Talk with your health care provider about: ? The risks and benefits of testing. ? How you can receive the lowest dose of radiation. What happens before the  procedure?  Wear comfortable clothing and remove any jewelry, glasses, dentures, and hearing aids.  Follow instructions from your health care provider about eating and drinking. This may include: ? For 12 hours before the procedure -- avoid caffeine. This includes tea, coffee, soda, energy drinks, and diet pills. Drink plenty of water or other fluids that do not have caffeine in them. Being well hydrated can prevent complications. ? For 4-6 hours before the procedure -- stop eating and drinking. The contrast dye can cause nausea, but this is less likely if your stomach is empty.  Ask your health care provider about changing or stopping your regular medicines. This is especially important if you are taking diabetes medicines, blood thinners, or medicines to treat problems with erections (erectile dysfunction). What happens during the procedure?   Hair on your chest may need to be removed so that small sticky patches called electrodes can be placed on your chest. These will transmit information that helps to monitor your heart during the procedure.  An IV will be inserted into one of your veins.  You might be given a medicine to control your heart rate during the procedure. This will help to ensure that good images are obtained.  You will be asked to lie on an exam table. This table will slide in and out of the CT machine during the procedure.  Contrast dye will be injected into the IV. You might feel warm, or you may get a metallic taste in your mouth.  You will be given a medicine called   nitroglycerin. This will relax or dilate the arteries in your heart.  The table that you are lying on will move into the CT machine tunnel for the scan.  The person running the machine will give you instructions while the scans are being done. You may be asked to: ? Keep your arms above your head. ? Hold your breath. ? Stay very still, even if the table is moving.  When the scanning is complete, you  will be moved out of the machine.  The IV will be removed. The procedure may vary among health care providers and hospitals. What can I expect after the procedure? After your procedure, it is common to have:  A metallic taste in your mouth from the contrast dye.  A feeling of warmth.  A headache from the nitroglycerin. Follow these instructions at home:  Take over-the-counter and prescription medicines only as told by your health care provider.  If you are told, drink enough fluid to keep your urine pale yellow. This will help to flush the contrast dye out of your body.  Most people can return to their normal activities right after the procedure. Ask your health care provider what activities are safe for you.  It is up to you to get the results of your procedure. Ask your health care provider, or the department that is doing the procedure, when your results will be ready.  Keep all follow-up visits as told by your health care provider. This is important. Contact a health care provider if:  You have any symptoms of allergy to the contrast dye. These include: ? Shortness of breath. ? Rash or hives. ? A racing heartbeat. Summary  A cardiac CT angiogram is a procedure to look at the heart and the area around the heart. It may be done to help find the cause of chest pains or other symptoms of heart disease.  During this procedure, a large X-ray machine, called a CT scanner, takes detailed pictures of the heart and the surrounding area after a contrast dye has been injected into blood vessels in the area.  Ask your health care provider about changing or stopping your regular medicines before the procedure. This is especially important if you are taking diabetes medicines, blood thinners, or medicines to treat erectile dysfunction.  If you are told, drink enough fluid to keep your urine pale yellow. This will help to flush the contrast dye out of your body. This information is not  intended to replace advice given to you by your health care provider. Make sure you discuss any questions you have with your health care provider. Document Revised: 08/01/2019 Document Reviewed: 08/01/2019 Elsevier Patient Education  2020 Elsevier Inc. Testing With IV Contrast Material IV contrast material is a fluid that is used with some imaging tests. It is injected into your body through a vein. Contrast material is used when your health care providers need a detailed look at organs, tissues, or blood vessels that may not show up with the standard test. The material may be used when an X-ray, an MRI, a CT scan, or an ultrasound is done. IV contrast material may be used for imaging tests that check:  Muscles, skin, and fat.  Breasts.  Brain.  Digestive tract.  Heart.  Organs such as the liver, kidneys, lungs, bladder, and many others.  Arteries and veins. Tell a health care provider about:  Any allergies you have, especially an allergy to contrast material.  All medicines you are taking, including   metformin, beta blockers, NSAIDs (such as ibuprofen), interleukin-2, vitamins, herbs, eye drops, creams, and over-the-counter medicines.  Any problems you or family members have had with the use of contrast material.  Any blood disorders you have, such as sickle cell anemia.  Any surgeries you have had.  Any medical conditions you have or have had, especially alcohol abuse, dehydration, asthma, or kidney, liver, or heart problems.  Whether you are pregnant or may be pregnant.  Whether you are breastfeeding. Most contrast materials are safe for use in breastfeeding women. What are the risks? Generally, this is a safe procedure. However, problems may occur, including:  Headache.  Itching, skin rash, and hives.  Nausea and vomiting.  Allergic reactions.  Wheezing or difficulty breathing.  Abnormal heart rate.  Changes in blood pressure.  Throat swelling.  Kidney  damage. What happens before the procedure? Medicines Ask your health care provider about:  Changing or stopping your regular medicines. This is especially important if you are taking diabetes medicines or blood thinners.  Taking medicines such as aspirin and ibuprofen. These medicines can thin your blood. Do not take these medicines unless your health care provider tells you to take them.  Taking over-the-counter medicines, vitamins, herbs, and supplements. If you are at risk of having a reaction to the IV contrast material, you may be asked to take medicine before the procedure to prevent a reaction. General instructions  Follow instructions from your health care provider about eating or drinking restrictions.  You may have an exam or lab tests to make sure that you can safely get IV contrast material.  Ask if you will be given a medicine to help you relax (sedative) during the procedure. If so, plan to have someone take you home from the hospital or clinic. What happens during the procedure?  You may be given a sedative to help you relax.  An IV will be inserted into one of your veins.  Contrast material will be injected into your IV.  You may feel warmth or flushing as the contrast material enters your bloodstream.  You may have a metallic taste in your mouth for a few minutes.  The needle may cause some discomfort and bruising.  After the contrast material is in your body, the imaging test will be done. The procedure may vary among health care providers and hospitals. What can I expect after the procedure?  The IV will be removed.  You may be taken to a recovery area if sedation medicines were used. Your blood pressure, heart rate, breathing rate, and blood oxygen level will be monitored until you leave the hospital or clinic. Follow these instructions at home:   Take over-the-counter and prescription medicines only as told by your health care provider. ? Your health  care provider may tell you to not take certain medicines for a couple of days after the procedure. This is especially important if you are taking diabetes medicines.  If you are told, drink enough fluid to keep your urine pale yellow. This will help to remove the contrast material out of your body.  Do not drive for 24 hours if you were given a sedative during your procedure.  It is up to you to get the results of your procedure. Ask your health care provider, or the department that is doing the procedure, when your results will be ready.  Keep all follow-up visits as told by your health care provider. This is important. Contact a health care provider if:    You have redness, swelling, or pain near your IV site. Get help right away if:  You have an abnormal heart rhythm.  You have trouble breathing.  You have: ? Chest pain. ? Pain in your back, neck, arm, jaw, or stomach. ? Nausea or sweating. ? Hives or a rash.  You start shaking and cannot stop. These symptoms may represent a serious problem that is an emergency. Do not wait to see if the symptoms will go away. Get medical help right away. Call your local emergency services (911 in the [place] Do not drive yourself to the hospital. Summary  IV contrast material may be used for imaging tests to help your health care providers see your organs and tissues more clearly.  Tell your health care provider if you are pregnant or may be pregnant.  During the procedure, you may feel warmth or flushing as the contrast material enters your bloodstream.  After the procedure, drink enough fluid to keep your urine pale yellow. This information is not intended to replace advice given to you by your health care provider. Make sure you discuss any questions you have with your health care provider. Document Revised: 02/22/2019 Document Reviewed: 02/22/2019 Elsevier Patient Education  2020 Elsevier Inc.  

## 2020-09-25 ENCOUNTER — Other Ambulatory Visit: Payer: Self-pay | Admitting: *Deleted

## 2020-09-25 DIAGNOSIS — R0609 Other forms of dyspnea: Secondary | ICD-10-CM

## 2020-10-03 ENCOUNTER — Other Ambulatory Visit: Payer: Self-pay | Admitting: Physician Assistant

## 2020-10-03 DIAGNOSIS — R1909 Other intra-abdominal and pelvic swelling, mass and lump: Secondary | ICD-10-CM

## 2020-10-06 ENCOUNTER — Ambulatory Visit
Admission: RE | Admit: 2020-10-06 | Discharge: 2020-10-06 | Disposition: A | Discharge status: Home / Self Care | Payer: Managed Care, Other (non HMO) | Source: Ambulatory Visit | Attending: Physician Assistant | Admitting: Physician Assistant

## 2020-10-06 ENCOUNTER — Other Ambulatory Visit: Payer: Self-pay

## 2020-10-06 DIAGNOSIS — R1909 Other intra-abdominal and pelvic swelling, mass and lump: Secondary | ICD-10-CM

## 2020-10-06 MED ORDER — IOPAMIDOL (ISOVUE-300) INJECTION 61%
100.0000 mL | Freq: Once | INTRAVENOUS | Status: AC | PRN
  Administered 2020-10-06: 100 mL via INTRAVENOUS

## 2020-10-16 ENCOUNTER — Ambulatory Visit (HOSPITAL_COMMUNITY): Payer: Managed Care, Other (non HMO) | Attending: Cardiovascular Disease

## 2020-10-16 ENCOUNTER — Telehealth: Payer: Self-pay | Admitting: Internal Medicine

## 2020-10-16 ENCOUNTER — Other Ambulatory Visit: Payer: Self-pay

## 2020-10-16 DIAGNOSIS — R0609 Other forms of dyspnea: Secondary | ICD-10-CM

## 2020-10-16 DIAGNOSIS — R0600 Dyspnea, unspecified: Secondary | ICD-10-CM | POA: Diagnosis not present

## 2020-10-16 LAB — ECHOCARDIOGRAM COMPLETE
Area-P 1/2: 3.85 cm2
S' Lateral: 2.3 cm

## 2020-10-16 NOTE — Telephone Encounter (Signed)
Patient returning phone call, connected call to [facility]

## 2020-11-18 ENCOUNTER — Other Ambulatory Visit: Payer: Self-pay | Admitting: Pain Medicine

## 2020-11-18 DIAGNOSIS — M549 Dorsalgia, unspecified: Secondary | ICD-10-CM

## 2020-12-01 ENCOUNTER — Other Ambulatory Visit: Payer: Managed Care, Other (non HMO)

## 2020-12-16 ENCOUNTER — Ambulatory Visit: Payer: Managed Care, Other (non HMO) | Admitting: Internal Medicine

## 2020-12-16 NOTE — Progress Notes (Deleted)
Cardiology Office Note:    Date:  12/16/2020   ID:  Kelly Combs, DOB 04/05/1966, MRN [id number]  PCP:  [facility] Cardiologist:  No primary care provider on file.  [facility] Electrophysiologist:  None   Referring MD: [facility]   CC: follow up SOB  History of Present Illness:    Kelly Combs is a 54 y.o. female with a hx of HTN, HLD, Pre-diabetes, Morbid Obesity who presented for evaluation 09/15/20  Patient notes that she is doing ***.  Since day prior/last visit notes *** changes.  Relevant interval testing or therapy include ***.  There are no*** interval hospital/ED visit.    No chest pain or pressure ***.  No SOB/DOE*** and no PND/Orthopnea***.  No weight gain or leg swelling***.  No palpitations or syncope ***.  Ambulatory blood pressure ***.   Past Medical History:  Diagnosis Date  . Fibroids   . Hyperlipidemia   . Hypertension   . Insomnia   . Lumbago with sciatica, left side   . Lumbago with sciatica, right side   . Morbid obesity (HCC)   . Other chronic pain   . Prediabetes     Past Surgical History:  Procedure Laterality Date  . ABDOMINAL SURGERY      Current Medications: No outpatient medications have been marked as taking for the 12/16/20 encounter (Appointment) with Christell Constant, MD.     Allergies:   Patient has no known allergies.   Social History   Socioeconomic History  . Marital status: Married    Spouse name: Not on file  . Number of children: Not on file  . Years of education: Not on file  . Highest education level: Not on file  Occupational History  . Not on file  Tobacco Use  . Smoking status: Never Smoker  . Smokeless tobacco: Never Used  Substance and Sexual Activity  . Alcohol use: No  . Drug use: Not on file  . Sexual activity: Not on file  Other Topics Concern  . Not on file  Social History Narrative  . Not on file   Social Determinants of  Health   Financial Resource Strain: Not on file  Food Insecurity: Not on file  Transportation Needs: Not on file  Physical Activity: Not on file  Stress: Not on file  Social Connections: Not on file  Works for [employer]; recent promotion.   Family History: The patient's family history is negative for Breast cancer. Dad died of CHF and had MI - in diabetic Grandfather MI. Aunt- MI.  ROS:   Please see the history of present illness.    All other systems reviewed and are negative.  EKGs/Labs/Other Studies Reviewed:    The following studies were reviewed today:  EKG:   09/15/20 Sinus Rhythm 79 with LVH without TWI or ST changes.  Transthoracic Echocardiogram: Date: 10/16/20 Results: Normal BiV function 1. Left ventricular ejection fraction, by estimation, is 55 to 60%. Left  ventricular ejection fraction by 3D volume is 58 %. The left ventricle has  normal function. The left ventricle has no regional wall motion  abnormalities. Left ventricular diastolic  parameters are consistent with Grade I diastolic dysfunction (impaired  relaxation). The average left ventricular global longitudinal strain is  -20.7 %. The global longitudinal strain is normal.  2. Right ventricular systolic function is normal. The right ventricular  size is normal. There is normal pulmonary artery systolic pressure. The  estimated right ventricular systolic pressure is [id number] mmHg.  3. The mitral valve is grossly normal. No evidence of mitral valve  regurgitation. No evidence of mitral stenosis.  4. The aortic valve is tricuspid. Aortic valve regurgitation is not  visualized. No aortic stenosis is present.  5. The inferior vena cava is normal in size with greater than 50%  respiratory variability, suggesting right atrial pressure of 3 mmHg.   Cardiac CT  Date:09/23/20 Results: IMPRESSION: 1. Coronary calcium score of 0. This was 0 percentile for age and sex matched control. 2. Normal coronary  origin with right dominance. 3. No evidence of CAD.  Recent Labs: No results found for requested labs within last 8760 hours.  Recent Lipid Panel No results found for: CHOL, TRIG, HDL, CHOLHDL, VLDL, LDLCALC, LDLDIRECT  Physical Exam:    VS:  There were no vitals taken for this visit.    Wt Readings from Last 3 Encounters:  09/15/20 176 lb 9.6 oz (80.1 kg)    *** GEN: Obese female well developed in no acute distress HEENT: Normal NECK: No JVD; No carotid bruits LYMPHATICS: No lymphadenopathy CARDIAC: RRR, no murmurs, rubs, gallops RESPIRATORY:  Clear to auscultation without rales, wheezing or rhonchi  ABDOMEN: Soft, non-tender, non-distended MUSCULOSKELETAL:  No edema; No deformity  SKIN: Warm and dry NEUROLOGIC:  Alert and oriented x 3 PSYCHIATRIC:  Normal affect   ASSESSMENT:    No diagnosis found. PLAN:    In order of problems listed above:  *** Essential Hypertension Morbid Obesity - ambulatory blood pressure ***, will start/continue ambulatory BP monitoring; gave education on how to perform ambulatory blood pressure monitoring including the frequency and technique; goal ambulatory blood pressure < 135/85 on average - continue home medications with the exception of *** - will get labs in 7-10 days (BMET, Mg***) - OSA Risk ***, Epworth Sleepiness Scale great than *** - concern for secondary HTN, will order renal artery duplex, and Aldo/renin***  - Arm/Leg BP Differential < 20 mm Hg; not suggestive of coaracation; Arm BP differential < 15 mm Hg not suggestive of subclavian stenosis - discussed diet (DASH/low sodium), and exercise/weight loss interventions ***  Hyperlipidemia (familial/mixed) -LDL goal less than ***70/100 - Fasting triglycerides notable for -continue current statin - Zetia vs Repatha*** - Bempedoic acid 180 mg PO daily *** tendon rupture - Vascepa*** - low threshold to send to lipid clinic for additional patient support *** - would stop niacin as  it can raise blood sugars and increase uric acid*** - would stop OTC fish oil has little cardiovascular protection, is not regulated by FDA as it is actually a "supplement" and can continue incorrect amounts of DHA/EPA or harmful fats*** -Recheck lipid profile LFTs - gave education on dietary changes   3-4 months to discuss primary and secondary prevention  Medication Adjustments/Labs and Tests Ordered: Current medicines are reviewed at length with the patient today.  Concerns regarding medicines are outlined above.  No orders of the defined types were placed in this encounter.  No orders of the defined types were placed in this encounter.   There are no Patient Instructions on file for this visit.   Signed, Werner Lean, MD  12/16/2020 7:53 AM    [place]

## 2020-12-27 ENCOUNTER — Ambulatory Visit
Admission: RE | Admit: 2020-12-27 | Discharge: 2020-12-27 | Disposition: A | Discharge status: Home / Self Care | Payer: Managed Care, Other (non HMO) | Source: Ambulatory Visit | Attending: Pain Medicine | Admitting: Pain Medicine

## 2020-12-27 ENCOUNTER — Other Ambulatory Visit: Payer: Self-pay

## 2020-12-27 DIAGNOSIS — M549 Dorsalgia, unspecified: Secondary | ICD-10-CM

## 2021-01-02 ENCOUNTER — Other Ambulatory Visit: Payer: Self-pay | Admitting: Physician Assistant

## 2021-01-02 DIAGNOSIS — Z1231 Encounter for screening mammogram for malignant neoplasm of breast: Secondary | ICD-10-CM

## 2021-03-19 ENCOUNTER — Other Ambulatory Visit: Payer: Self-pay | Admitting: Pain Medicine

## 2021-03-19 DIAGNOSIS — M542 Cervicalgia: Secondary | ICD-10-CM

## 2021-03-19 DIAGNOSIS — G8929 Other chronic pain: Secondary | ICD-10-CM

## 2021-03-19 DIAGNOSIS — M79603 Pain in arm, unspecified: Secondary | ICD-10-CM

## 2021-04-02 ENCOUNTER — Other Ambulatory Visit: Payer: Self-pay | Admitting: Physician Assistant

## 2021-04-02 DIAGNOSIS — N6311 Unspecified lump in the right breast, upper outer quadrant: Secondary | ICD-10-CM

## 2021-04-02 DIAGNOSIS — N6342 Unspecified lump in left breast, subareolar: Secondary | ICD-10-CM

## 2021-04-08 ENCOUNTER — Ambulatory Visit
Admission: RE | Admit: 2021-04-08 | Discharge: 2021-04-08 | Disposition: A | Discharge status: Home / Self Care | Payer: Managed Care, Other (non HMO) | Source: Ambulatory Visit | Attending: Pain Medicine | Admitting: Pain Medicine

## 2021-04-08 ENCOUNTER — Other Ambulatory Visit: Payer: Self-pay

## 2021-04-08 DIAGNOSIS — G8929 Other chronic pain: Secondary | ICD-10-CM

## 2021-04-08 DIAGNOSIS — M79603 Pain in arm, unspecified: Secondary | ICD-10-CM

## 2021-04-08 DIAGNOSIS — M542 Cervicalgia: Secondary | ICD-10-CM

## 2021-05-11 ENCOUNTER — Other Ambulatory Visit: Payer: Self-pay

## 2021-05-11 ENCOUNTER — Ambulatory Visit
Admission: RE | Admit: 2021-05-11 | Discharge: 2021-05-11 | Disposition: A | Discharge status: Home / Self Care | Payer: Managed Care, Other (non HMO) | Source: Ambulatory Visit | Attending: Physician Assistant | Admitting: Physician Assistant

## 2021-05-11 ENCOUNTER — Ambulatory Visit: Payer: Managed Care, Other (non HMO)

## 2021-05-11 DIAGNOSIS — N6342 Unspecified lump in left breast, subareolar: Secondary | ICD-10-CM

## 2021-05-11 DIAGNOSIS — N6311 Unspecified lump in the right breast, upper outer quadrant: Secondary | ICD-10-CM

## 2022-02-01 ENCOUNTER — Encounter: Payer: Self-pay | Admitting: Plastic Surgery

## 2022-02-01 ENCOUNTER — Other Ambulatory Visit: Payer: Self-pay

## 2022-02-01 ENCOUNTER — Inpatient Hospital Stay: Payer: Medicaid Other

## 2022-02-01 ENCOUNTER — Encounter: Payer: Self-pay | Admitting: Internal Medicine

## 2022-02-01 ENCOUNTER — Ambulatory Visit: Payer: Medicaid Other | Admitting: Plastic Surgery

## 2022-02-01 ENCOUNTER — Inpatient Hospital Stay: Payer: Medicaid Other | Attending: Internal Medicine | Admitting: Internal Medicine

## 2022-02-01 VITALS — BP 113/81 | HR 86 | Ht 59.0 in | Wt 174.4 lb

## 2022-02-01 VITALS — BP 130/95 | HR 89 | Temp 97.2°F | Resp 19 | Ht 59.0 in | Wt 174.0 lb

## 2022-02-01 DIAGNOSIS — R609 Edema, unspecified: Secondary | ICD-10-CM

## 2022-02-01 DIAGNOSIS — R233 Spontaneous ecchymoses: Secondary | ICD-10-CM | POA: Diagnosis not present

## 2022-02-01 LAB — CBC WITH DIFFERENTIAL (CANCER CENTER ONLY)
Abs Immature Granulocytes: 0.01 10*3/uL (ref 0.00–0.07)
Basophils Absolute: 0 10*3/uL (ref 0.0–0.1)
Basophils Relative: 1 %
Eosinophils Absolute: 0.1 10*3/uL (ref 0.0–0.5)
Eosinophils Relative: 2 %
HCT: 34.1 % — ABNORMAL LOW (ref 36.0–46.0)
Hemoglobin: 11.8 g/dL — ABNORMAL LOW (ref 12.0–15.0)
Immature Granulocytes: 0 %
Lymphocytes Relative: 54 %
Lymphs Abs: 2.7 10*3/uL (ref 0.7–4.0)
MCH: 30.4 pg (ref 26.0–34.0)
MCHC: 34.6 g/dL (ref 30.0–36.0)
MCV: 87.9 fL (ref 80.0–100.0)
Monocytes Absolute: 0.4 10*3/uL (ref 0.1–1.0)
Monocytes Relative: 8 %
Neutro Abs: 1.8 10*3/uL (ref 1.7–7.7)
Neutrophils Relative %: 35 %
Platelet Count: 347 10*3/uL (ref 150–400)
RBC: 3.88 MIL/uL (ref 3.87–5.11)
RDW: 13.5 % (ref 11.5–15.5)
WBC Count: 5.1 10*3/uL (ref 4.0–10.5)
nRBC: 0 % (ref 0.0–0.2)

## 2022-02-01 LAB — CMP (CANCER CENTER ONLY)
ALT: 23 U/L (ref 0–44)
AST: 24 U/L (ref 15–41)
Albumin: 4.5 g/dL (ref 3.5–5.0)
Alkaline Phosphatase: 80 U/L (ref 38–126)
Anion gap: 7 (ref 5–15)
BUN: 14 mg/dL (ref 6–20)
CO2: 32 mmol/L (ref 22–32)
Calcium: 9.8 mg/dL (ref 8.9–10.3)
Chloride: 104 mmol/L (ref 98–111)
Creatinine: 0.72 mg/dL (ref 0.44–1.00)
GFR, Estimated: >60 mL/min (ref >60)
Glucose, Bld: 98 mg/dL (ref 70–99)
Potassium: 3.6 mmol/L (ref 3.5–5.1)
Sodium: 143 mmol/L (ref 135–145)
Total Bilirubin: 0.4 mg/dL (ref 0.3–1.2)
Total Protein: 7.5 g/dL (ref 6.5–8.1)

## 2022-02-01 LAB — PROTIME-INR
INR: 1 (ref 0.8–1.2)
Prothrombin Time: 13.2 seconds (ref 11.4–15.2)

## 2022-02-01 LAB — APTT: aPTT: 30 seconds (ref 24–36)

## 2022-02-01 NOTE — Progress Notes (Signed)
[place] Telephone:([phone]) [phone]   Fax:([phone]  CONSULT NOTE  REFERRING PHYSICIAN: Dr. Reece Agar sun  REASON FOR CONSULTATION:  56 years old African-American female with bruising issues  HPI Kelly Combs is a 56 y.o. female with past medical history significant for hypertension, dyslipidemia, insomnia, lumbago, prediabetes, uterine fibroids as well as chronic pain issues followed by pain clinic as well as lipidemia.  The patient mentioned that she was seen recently by her primary care physician and she complained to her of few areas of bruises in different parts of her body.  She was at [place] until recently before moving to [place] [facility] [place] [facility] and she was in the process of seeing a hematologist therefore this condition.  The patient denied having any other bleeding issues especially no gum bleed, epistaxis, hemoptysis, hematemesis, melena or hematochezia.  She had colonoscopy few years ago that showed evidence for hemorrhoids but no other abnormalities.  She is not currently on any anticoagulation or antiplatelets treatment.  She had previous surgery with no significant concern of prolonged bleeding. When seen today she complains of mild fatigue as well as constipation probably secondary to her narcotic treatment.  She denied having any chest pain, shortness of breath but has mild cough with no hemoptysis.  She denied having any recent weight loss or night sweats.  She has no headache or visual changes she has no nausea, vomiting or diarrhea but has constipation. Family history significant for father with prostate cancer.  Mother is healthy.  Maternal grandmother had breast cancer and paternal grandmother had lung cancer.  She also had several cousins with sickle cell disease. The patient is married and has 2 children.  She used to work for [employer].  She was accompanied today by her husband Kelly Combs.  She has no history for smoking but drinks alcohol occasionally  and no history of drug abuse. HPI  Past Medical History:  Diagnosis Date   Fibroids    Hyperlipidemia    Hypertension    Insomnia    Lumbago with sciatica, left side    Lumbago with sciatica, right side    Morbid obesity (HCC)    Other chronic pain    Prediabetes     Past Surgical History:  Procedure Laterality Date   ABDOMINAL SURGERY      Family History  Problem Relation Age of Onset   Breast cancer Paternal Grandmother     Social History Social History   Tobacco Use   Smoking status: Never   Smokeless tobacco: Never  Substance Use Topics   Alcohol use: No    No Known Allergies  Current Outpatient Medications  Medication Sig Dispense Refill   albuterol (VENTOLIN HFA) 108 (90 Base) MCG/ACT inhaler Inhale 1-2 puffs into the lungs every 6 (six) hours as needed for wheezing or shortness of breath. 18 g 0   atorvastatin (LIPITOR) 10 MG tablet Take 10 mg by mouth daily.     Liraglutide -Weight Management (SAXENDA) 18 MG/3ML SOPN Inject into the skin. (Patient not taking: Reported on 02/01/2022)     losartan-hydrochlorothiazide (HYZAAR) 50-12.5 MG tablet Take 1 tablet by mouth daily.     lubiprostone (AMITIZA) 24 MCG capsule Take 24 mcg by mouth 2 (two) times daily. (Patient not taking: Reported on 02/01/2022)     metoprolol tartrate (LOPRESSOR) 100 MG tablet Take 1 tablet by mouth 2 hours prior to Cardiac CT (Patient not taking: Reported on 02/01/2022) 1 tablet 0   oxyCODONE-acetaminophen (PERCOCET) 10-325 MG  tablet Take 1 tablet by mouth every 4 (four) hours as needed for pain.     zolpidem (AMBIEN) 10 MG tablet Take 10 mg by mouth at bedtime as needed for sleep.     No current facility-administered medications for this visit.    Review of Systems  Constitutional: positive for fatigue Eyes: negative Ears, nose, mouth, throat, and face: negative Respiratory: negative Cardiovascular: negative Gastrointestinal: negative Genitourinary:negative Integument/breast:  negative Hematologic/lymphatic: negative Musculoskeletal:negative Neurological: negative Behavioral/Psych: negative Endocrine: negative Allergic/Immunologic: negative  Physical Exam  ERX:VQMGQ, healthy, no distress, well nourished, well developed, and anxious SKIN: skin color, texture, turgor are normal, no rashes or significant lesions HEAD: Normocephalic, No masses, lesions, tenderness or abnormalities EYES: normal, PERRLA, Conjunctiva are pink and non-injected EARS: External ears normal, Canals clear OROPHARYNX:no exudate, no erythema, and lips, buccal mucosa, and tongue normal  NECK: supple, no adenopathy, no JVD LYMPH:  no palpable lymphadenopathy, no hepatosplenomegaly BREAST:not examined LUNGS: clear to auscultation , and palpation HEART: regular rate & rhythm, no murmurs, and no gallops ABDOMEN:abdomen soft, non-tender, normal bowel sounds, and no masses or organomegaly BACK: Back symmetric, no curvature., No CVA tenderness EXTREMITIES:no joint deformities, effusion, or inflammation, no edema  NEURO: alert & oriented x 3 with fluent speech, no focal motor/sensory deficits  PERFORMANCE STATUS: ECOG 1  LABORATORY DATA: Lab Results  Component Value Date   WBC 5.1 02/01/2022   HGB 11.8 (L) 02/01/2022   HCT 34.1 (L) 02/01/2022   MCV 87.9 02/01/2022   PLT 347 02/01/2022      Chemistry      Component Value Date/Time   NA 143 02/01/2022 1112   K 3.6 02/01/2022 1112   CL 104 02/01/2022 1112   CO2 32 02/01/2022 1112   BUN 14 02/01/2022 1112   CREATININE 0.72 02/01/2022 1112      Component Value Date/Time   CALCIUM 9.8 02/01/2022 1112   ALKPHOS 80 02/01/2022 1112   AST 24 02/01/2022 1112   ALT 23 02/01/2022 1112   BILITOT 0.4 02/01/2022 1112       RADIOGRAPHIC STUDIES: No results found.  ASSESSMENT: This is a very pleasant 56 years old African-American female presented for evaluation of easy bruising.   PLAN: I had a lengthy discussion with the patient and  her husband today about her condition.  The areas of bruising that the patient showed to me today are of very minimal.  I ordered several studies for evaluation of her condition including repeat CBC which showed mild anemia with hemoglobin of 11.8 and hematocrit 34.1%.  The patient has normal white blood count as well as normal platelets count.  Her chemistry is unremarkable. I also order PT/INR, and PTT.  These were reported to be normal indicating no evidence of coagulopathy. I assured the patient and asked her to follow-up with her primary care physician from now on but I will be happy to see her in the future if needed. She was advised to call if she has any other concerning issues in the future.  The patient voices understanding of current disease status and treatment options and is in agreement with the current care plan.  All questions were answered. The patient knows to call the clinic with any problems, questions or concerns. We can certainly see the patient much sooner if necessary.  Thank you so much for allowing me to participate in the care of [facility] I will continue to follow up the patient with you and assist in her care. The total  time spent in the appointment was 60 minutes.  Disclaimer: This note was dictated with voice recognition software. Similar sounding words can inadvertently be transcribed and may not be corrected upon review.   Eilleen Kempf February 01, 2022, 12:09 PM

## 2022-02-01 NOTE — Progress Notes (Signed)
Referring Provider [employer], [facility] @ [place] [address],  [place] [address]   CC:  Lymphedema legs and arms   Kelly Combs is an 56 y.o. female.  HPI: Patient is a 57 year old who has been diagnosed with lipedema and Sono Bello in [facility].  She notes painful lumps in her arms and legs that she has had.  She was referred to her primary care doctor for this and then referred here.  She also notes bruising in her legs and has been seen by hematology in the past in [place] but she states she had never completed her work-up.  No Known Allergies  Outpatient Encounter Medications as of 02/01/2022  Medication Sig   albuterol (VENTOLIN HFA) 108 (90 Base) MCG/ACT inhaler Inhale 1-2 puffs into the lungs every 6 (six) hours as needed for wheezing or shortness of breath.   atorvastatin (LIPITOR) 10 MG tablet Take 10 mg by mouth daily.   Liraglutide -Weight Management (SAXENDA) 18 MG/3ML SOPN Inject into the skin.   losartan-hydrochlorothiazide (HYZAAR) 50-12.5 MG tablet Take 1 tablet by mouth daily.   lubiprostone (AMITIZA) 24 MCG capsule Take 24 mcg by mouth 2 (two) times daily.   metoprolol tartrate (LOPRESSOR) 100 MG tablet Take 1 tablet by mouth 2 hours prior to Cardiac CT   oxyCODONE-acetaminophen (PERCOCET) 10-325 MG tablet Take 1 tablet by mouth every 4 (four) hours as needed for pain.   zolpidem (AMBIEN) 10 MG tablet Take 10 mg by mouth at bedtime as needed for sleep.   No facility-administered encounter medications on file as of 02/01/2022.     Past Medical History:  Diagnosis Date   Fibroids    Hyperlipidemia    Hypertension    Insomnia    Lumbago with sciatica, left side    Lumbago with sciatica, right side    Morbid obesity (HCC)    Other chronic pain    Prediabetes     Past Surgical History:  Procedure Laterality Date   ABDOMINAL SURGERY      Family History  Problem Relation Age of Onset   Breast cancer Paternal  Grandmother     Social History   Social History Narrative   Not on file     Review of Systems General: Denies fevers, chills, weight loss CV: Denies chest pain, shortness of breath, palpitations None  Physical Exam Vitals with BMI 02/01/2022 09/23/2020 09/23/2020  Height 4\' 11"  - -  Weight 174 lbs 6 oz - -  BMI 37.10 - -  Systolic [phone]  Diastolic 81 72 88  Pulse 86 - -    General:  No acute distress,  Alert and oriented, Non-Toxic, Normal speech and affect Extremity: Patient has some adipose and excess skin bilateral upper extremities proximal arm.  She has some adipose and cellulite bilateral lower extremities medial and lateral.  Assessment/Plan I discussed with the patient that liposuction is a treatment for lipedema.  We could decompress her arms and her legs to an extent.  We discussed the limitations of liposuction that we cannot completely remove all fat but we could improve the appearance of the area.  I think it is uncertain whether this would improve her pain that she is having in her legs.  We discussed that if we do liposuction of her legs we will primarily focus medially to avoid lateral contour irregularities.  Pictures were obtained of the patient and placed in the chart with the patient's or guardian's permission.  31 minutes were spent with the patient.  Time was spent reviewing records, discussing surgical procedures with the patient and reviewing risks and benefits.  We discussed risks and benefits of liposuction for lipedema.  Lennice Sites 02/01/2022, 9:58 AM

## 2022-02-04 LAB — PTT FACTOR INHIBITOR (MIXING STUDY): aPTT: 25 s (ref 22.9–30.2)

## 2022-02-22 ENCOUNTER — Telehealth: Payer: Self-pay

## 2022-02-22 NOTE — Telephone Encounter (Signed)
Patient came by today and said she has been waiting almost a month for surgery and has been trying to reach out to [place]. I apologized and informed the patient that there is a process that we go through and did let her know that I would send a message back for [name] to reach out when she gets a chance. Kelly Combs said she has been dealing with the pain for months and is just ready for this surgery, wanted to know if there was another plastic's office that would be able to get her in for surgery sooner, did tell patient I was unsure.  ?

## 2022-03-05 ENCOUNTER — Telehealth: Payer: Self-pay | Admitting: Plastic Surgery

## 2022-03-05 NOTE — Telephone Encounter (Signed)
Called patient to advise her that Dr. Erin Hearing and I reviewed the guidelines for her insurance and she does not meet the criteria for medical necessity which would result in her being charged the full cost of the surgery after the surgery has been completed. Patient requested the amount of surgery if she paid out of pocket. Read the cosmetic quote that was given to her in February. Patient wants to attempt to move forward cosmetically. Advised her I would check on surgery dates available at [place] and get back with her.  ?

## 2022-03-08 ENCOUNTER — Telehealth: Payer: Self-pay | Admitting: Plastic Surgery

## 2022-03-08 NOTE — Telephone Encounter (Signed)
Pt is calling in stating that she spoke with her insurance company and that the procedure that she is wanting to have done is covered 100% pt doesn't have to pay out of pocket and if she had pain, swelling and put in as bariatric.  The office sent the office notes to the insurance company and they will cover it.  Pt would like to have a call back with clarification of the coverage. ?

## 2022-03-22 ENCOUNTER — Encounter: Payer: Medicaid Other | Admitting: Plastic Surgery

## 2022-04-07 ENCOUNTER — Encounter (HOSPITAL_BASED_OUTPATIENT_CLINIC_OR_DEPARTMENT_OTHER): Payer: Self-pay

## 2022-04-07 ENCOUNTER — Ambulatory Visit (HOSPITAL_BASED_OUTPATIENT_CLINIC_OR_DEPARTMENT_OTHER): Admit: 2022-04-07 | Payer: Medicaid Other | Admitting: Plastic Surgery

## 2022-04-07 IMAGING — MG DIGITAL DIAGNOSTIC BILAT W/ TOMO W/ CAD
8 series · 8 of 24 positions shown · non-contrast
Comparison: Previous exam(s).

CLINICAL DATA: Patient's physician palpated an abnormality in the
subareolar region of the left breast and in the upper-outer quadrant
of the right breast.

EXAM:
DIGITAL DIAGNOSTIC BILATERAL MAMMOGRAM WITH TOMOSYNTHESIS AND CAD;
ULTRASOUND LEFT BREAST LIMITED; ULTRASOUND RIGHT BREAST LIMITED
TECHNIQUE: Bilateral digital diagnostic mammography and breast tomosynthesis
was performed. The images were evaluated with computer-aided
detection.; Targeted ultrasound examination of the left breast was
performed; Targeted ultrasound examination of the right breast was
performed

[L CC synth-2D]
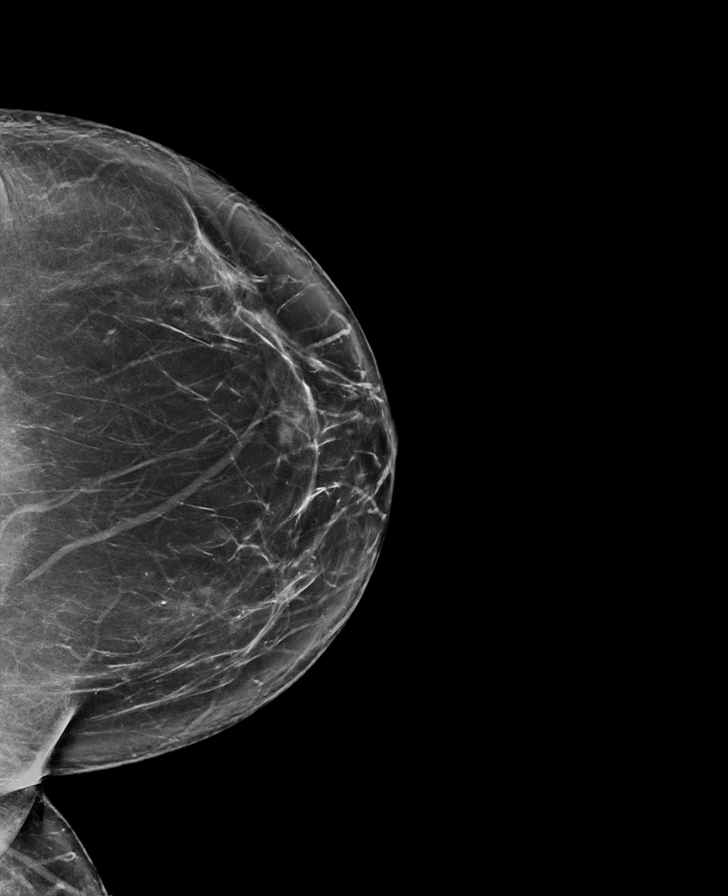

[L MLO synth-2D]
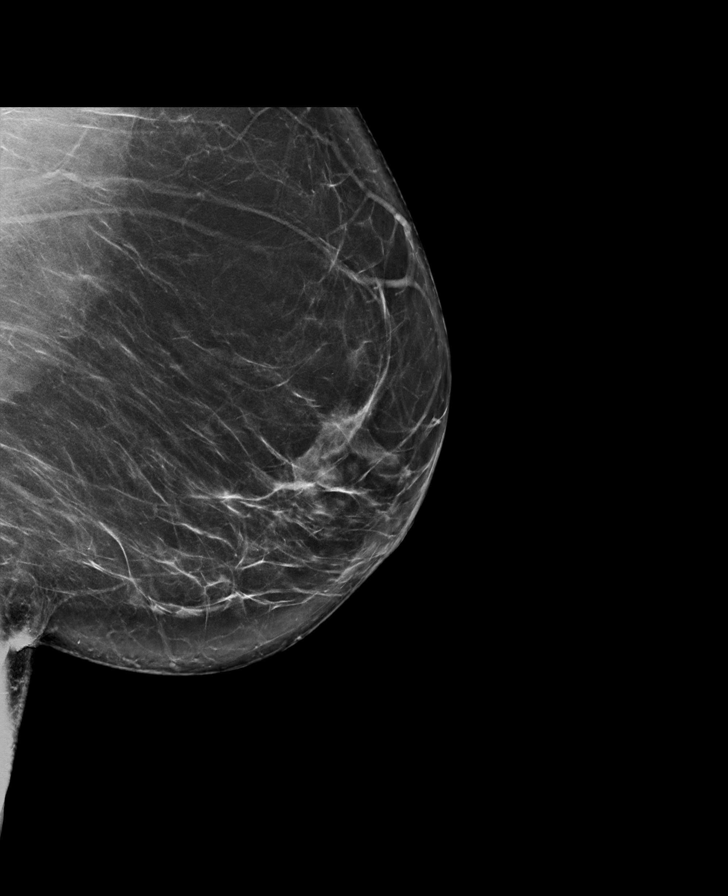

[R MLO synth-2D]
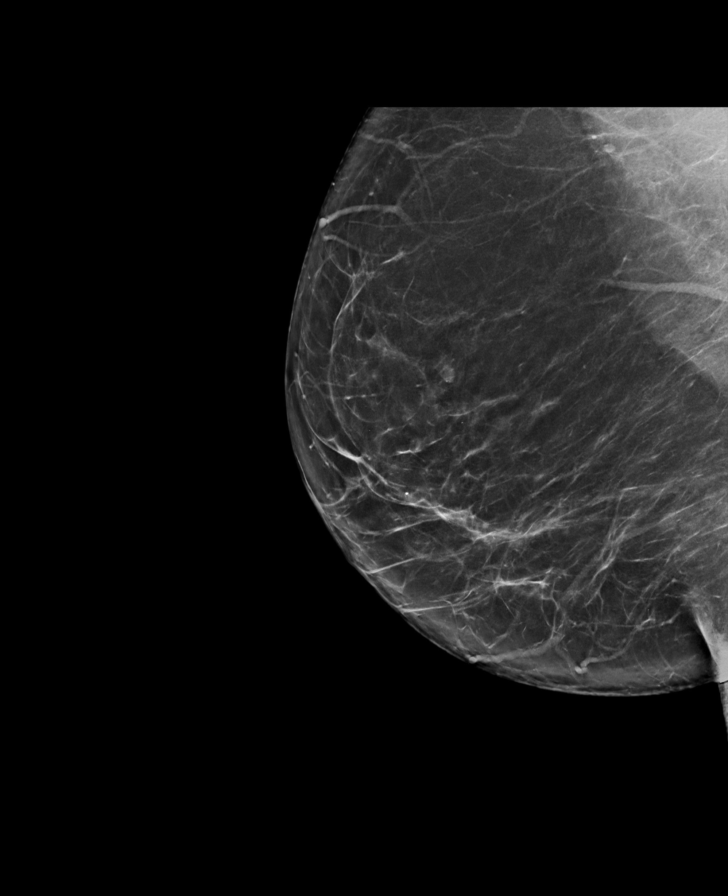

[R CC synth-2D]
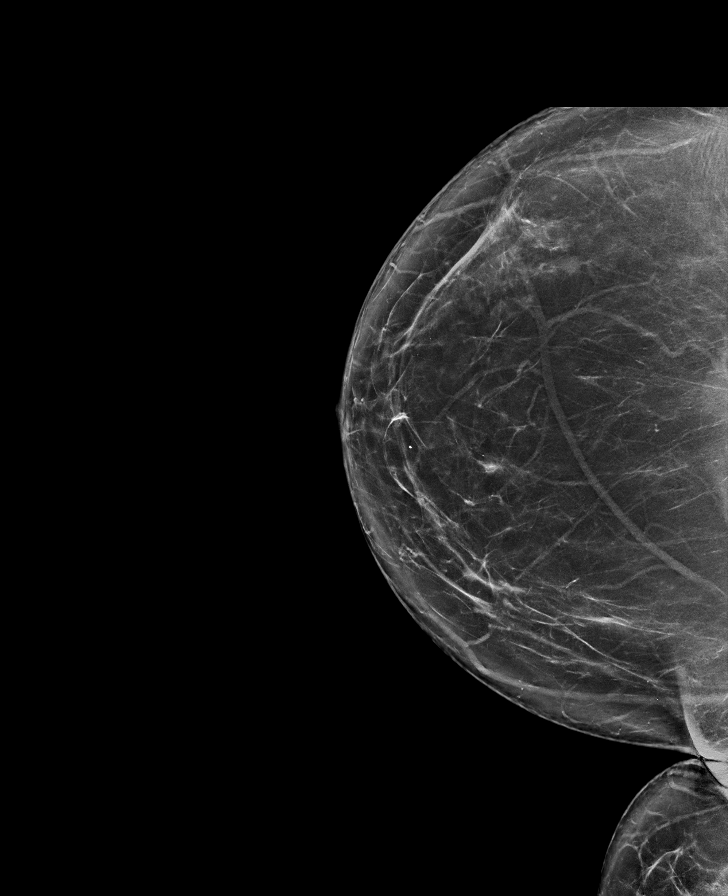

[R MLO tomo · tomo slice 47/92.0]
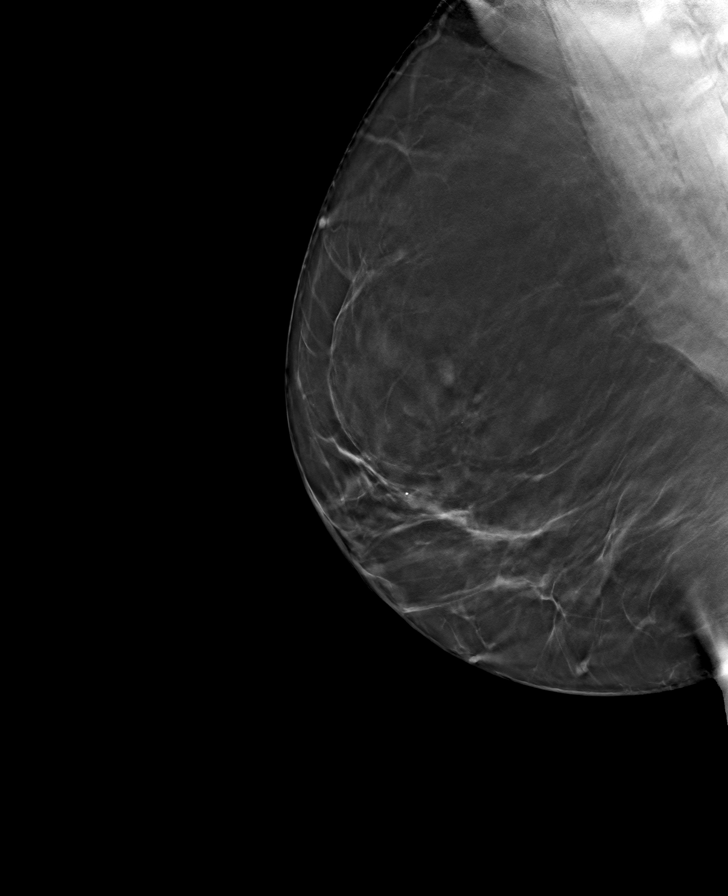

[L MLO tomo · tomo slice 45/89.0]
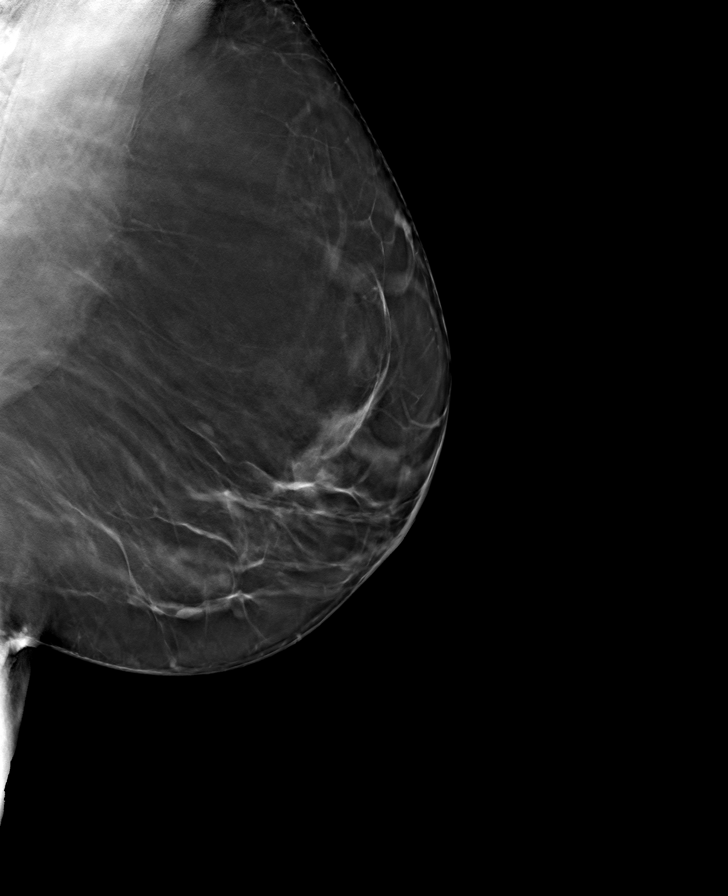

[L CC tomo · tomo slice 45/88.0]
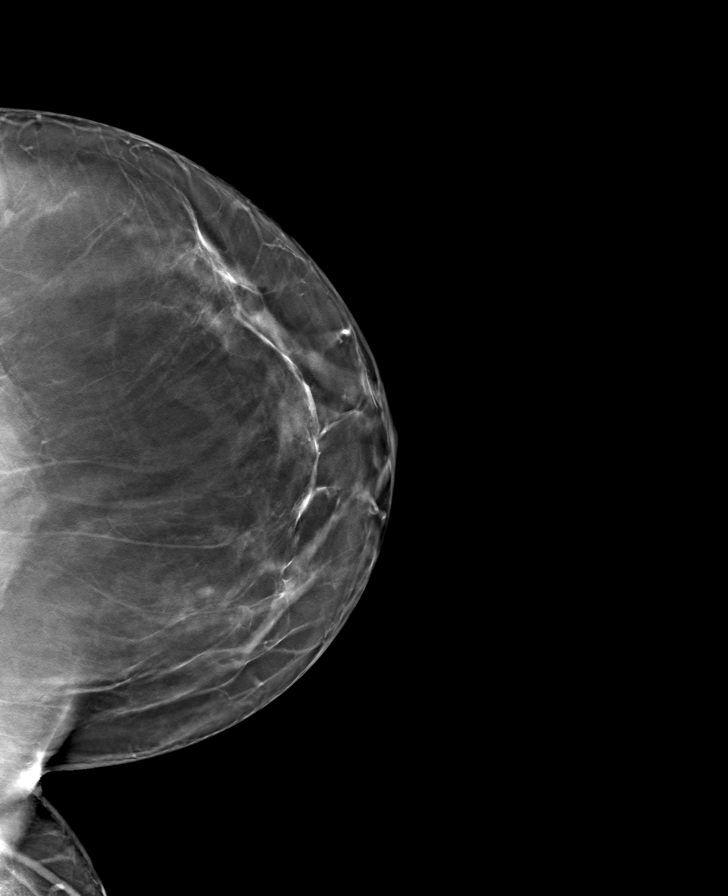

[R CC tomo · tomo slice 45/90.0]
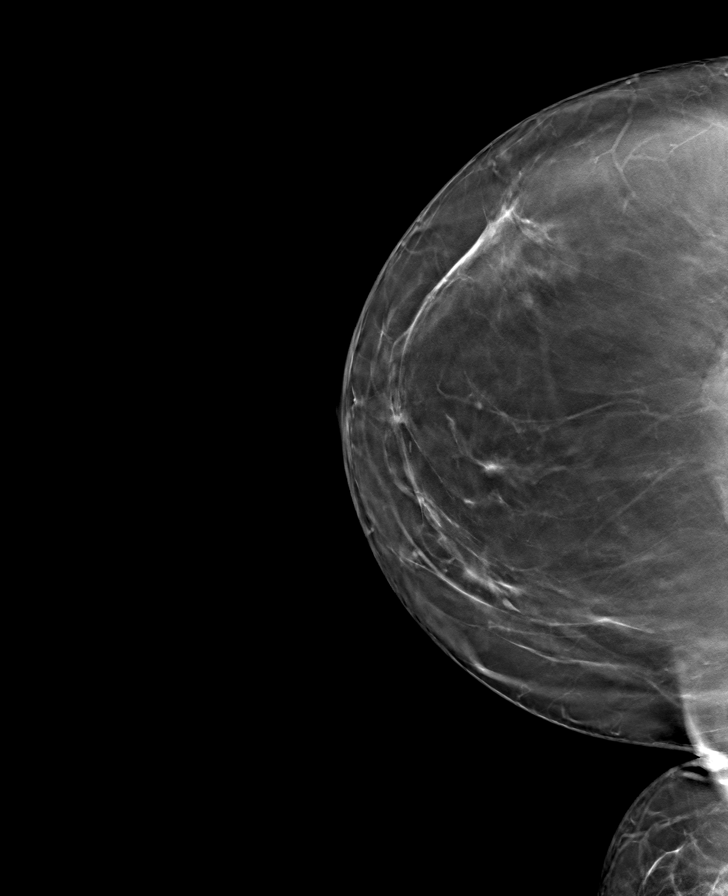

[8 of 24 positions shown; findings below may reference images not displayed]

ACR Breast Density Category b: There are scattered areas of
fibroglandular density.
FINDINGS: No suspicious mass, malignant type microcalcifications or distortion
detected in either breast.

Targeted ultrasound is performed, showing normal tissue in the
subareolar region of the left breast. An anechoic cyst is seen in
the 9 o'clock region of the right breast 5 cm from the nipple
measuring 6 x 4 x 5 mm. No additional mass is seen in the
upper-outer quadrant of the right breast.
IMPRESSION: No evidence of malignancy in either breast.

RECOMMENDATION:
Bilateral screening mammogram in 1 year is recommended.

I have discussed the findings and recommendations with the patient.
If applicable, a reminder letter will be sent to the patient
regarding the next appointment.

BI-RADS CATEGORY  2: Benign.

## 2022-04-07 SURGERY — LIPOSUCTION
Anesthesia: General | Site: Arm Upper | Laterality: Bilateral

## 2022-04-16 ENCOUNTER — Encounter: Payer: Medicaid Other | Admitting: Plastic Surgery

## 2022-06-11 ENCOUNTER — Other Ambulatory Visit: Payer: Self-pay | Admitting: Physician Assistant

## 2022-06-11 DIAGNOSIS — M545 Low back pain, unspecified: Secondary | ICD-10-CM

## 2022-06-25 ENCOUNTER — Other Ambulatory Visit: Payer: Medicaid Other

## 2022-07-11 ENCOUNTER — Ambulatory Visit
Admission: RE | Admit: 2022-07-11 | Discharge: 2022-07-11 | Disposition: A | Discharge status: Home / Self Care | Payer: Medicaid Other | Source: Ambulatory Visit | Attending: Physician Assistant | Admitting: Physician Assistant

## 2022-07-11 DIAGNOSIS — M545 Low back pain, unspecified: Secondary | ICD-10-CM

## 2022-08-04 ENCOUNTER — Ambulatory Visit
Admission: RE | Admit: 2022-08-04 | Discharge: 2022-08-04 | Disposition: A | Discharge status: Home / Self Care | Payer: Medicaid Other | Source: Ambulatory Visit | Attending: Physician Assistant | Admitting: Physician Assistant

## 2022-08-04 ENCOUNTER — Other Ambulatory Visit: Payer: Self-pay | Admitting: Physician Assistant

## 2022-08-04 DIAGNOSIS — R109 Unspecified abdominal pain: Secondary | ICD-10-CM

## 2022-08-04 DIAGNOSIS — K5900 Constipation, unspecified: Secondary | ICD-10-CM

## 2022-08-26 ENCOUNTER — Ambulatory Visit
Admission: RE | Admit: 2022-08-26 | Discharge: 2022-08-26 | Disposition: A | Discharge status: Home / Self Care | Payer: Medicaid Other | Source: Ambulatory Visit | Attending: Physician Assistant | Admitting: Physician Assistant

## 2022-08-26 DIAGNOSIS — R109 Unspecified abdominal pain: Secondary | ICD-10-CM

## 2022-08-26 MED ORDER — IOPAMIDOL (ISOVUE-300) INJECTION 61%
100.0000 mL | Freq: Once | INTRAVENOUS | Status: AC | PRN
  Administered 2022-08-26: 100 mL via INTRAVENOUS

## 2022-09-02 ENCOUNTER — Other Ambulatory Visit (HOSPITAL_COMMUNITY): Payer: Self-pay | Admitting: Gastroenterology

## 2022-09-02 DIAGNOSIS — R112 Nausea with vomiting, unspecified: Secondary | ICD-10-CM

## 2022-09-20 ENCOUNTER — Encounter (HOSPITAL_COMMUNITY)
Admission: RE | Admit: 2022-09-20 | Discharge: 2022-09-20 | Disposition: A | Discharge status: Home / Self Care | Payer: Medicaid Other | Source: Ambulatory Visit | Attending: Gastroenterology | Admitting: Gastroenterology

## 2022-09-20 DIAGNOSIS — R112 Nausea with vomiting, unspecified: Secondary | ICD-10-CM | POA: Insufficient documentation

## 2022-09-20 MED ORDER — TECHNETIUM TC 99M SULFUR COLLOID
1.9000 | Freq: Once | INTRAVENOUS | Status: AC | PRN
  Administered 2022-09-20: 1.9 via ORAL

## 2022-09-21 ENCOUNTER — Other Ambulatory Visit: Payer: Self-pay | Admitting: Obstetrics and Gynecology

## 2022-09-21 DIAGNOSIS — R102 Pelvic and perineal pain: Secondary | ICD-10-CM

## 2022-10-13 ENCOUNTER — Ambulatory Visit
Admission: RE | Admit: 2022-10-13 | Discharge: 2022-10-13 | Disposition: A | Discharge status: Home / Self Care | Payer: Medicaid Other | Source: Ambulatory Visit | Attending: Obstetrics and Gynecology | Admitting: Obstetrics and Gynecology

## 2022-10-13 DIAGNOSIS — R102 Pelvic and perineal pain: Secondary | ICD-10-CM

## 2022-10-13 MED ORDER — GADOPICLENOL 0.5 MMOL/ML IV SOLN
7.5000 mL | Freq: Once | INTRAVENOUS | Status: AC | PRN
  Administered 2022-10-13: 7.5 mL via INTRAVENOUS

## 2022-10-21 ENCOUNTER — Other Ambulatory Visit: Payer: Self-pay | Admitting: Obstetrics and Gynecology

## 2022-10-21 DIAGNOSIS — R102 Pelvic and perineal pain: Secondary | ICD-10-CM

## 2022-11-03 ENCOUNTER — Ambulatory Visit: Payer: Medicaid Other | Admitting: Obstetrics & Gynecology

## 2022-11-03 ENCOUNTER — Telehealth: Payer: Self-pay

## 2022-11-03 NOTE — Telephone Encounter (Signed)
Patient did not show up today for her scheduled appointment.  However, patient's chart was reviewed.  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  No signs of any malignancy or other concerns seen on imaging.  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 EXAM: MRI PELVIS WITHOUT AND WITH CONTRAST TECHNIQUE: Multiplanar multisequence MR imaging of the pelvis was performed both before and after administration of intravenous contrast. CONTRAST:  7.5 mL Vueway COMPARISON:  CT of the abdomen and pelvis from August 26, 2022 FINDINGS: Urinary Tract: Urinary bladder is under distended. No signs of distal ureteral dilation or perivesical stranding. Bowel: Mildly distended ileal loops in the lower abdomen without signs of adjacent inflammation. Vascular/Lymphatic: Signs of LEFT ovarian venous thrombosis that were suspected previously are not well assessed on the current study. There is slight increased signal at the upper margin of the visualized LEFT ovarian vein (image 1/9) there is also persistent central low signal on postcontrast images (image 1/14) this is present in the LEFT ovarian vein. No stranding about the LEFT ovary. No edema within the ovarian stroma. No signs of pelvic lymphadenopathy Reproductive: Post hysterectomy with normal appearance of bilateral ovaries. Other:  No ascites Musculoskeletal: No suspicious bone lesions identified. IMPRESSION: 1. Signs of LEFT ovarian venous thrombosis that were suspected previously are not well assessed on the current study. Findings suggest persistent thrombus within the vein which is only seen at the lower most aspect of the vein on the current study. If there is need for further dedicated assessment  CT or MR venography of the abdomen would better illustrated this finding. 2. No stranding about the LEFT ovary or enlargement of the LEFT ovary to indicate acute ovarian process. 3. No signs of pelvic lymphadenopathy. 4. Post hysterectomy with normal appearance of bilateral ovaries. 5. Mildly distended ileal loops in the lower abdomen without signs of adjacent inflammation. Correlate with any signs of delayed enteric transit or GI symptoms. Electronically Signed   By: Zetta Bills M.D.   On: 10/15/2022 11:12   NM GASTRIC EMPTYING  Result Date: 09/20/2022 CLINICAL DATA:  One year of abdominal pain, history of diabetes concern for gastroparesis. EXAM: NUCLEAR MEDICINE GASTRIC EMPTYING SCAN TECHNIQUE: After oral ingestion of radiolabeled meal, sequential abdominal images were obtained for 3 hours. Percentage of activity emptying the stomach was calculated at 1 hour, 2 hour, and 3 hours. RADIOPHARMACEUTICALS:  1.9 mCi Tc-35msulfur colloid in standardized meal COMPARISON:  CT August 26, 2022 FINDINGS: Expected location of the stomach in the left upper quadrant. Ingested meal empties the stomach gradually over the course of the study. 50% emptied at 1 hr ( normal >= 10%) 77% emptied at 2 hr ( normal >= 40%) 93% emptied at 3 hr ( normal >= 70%) IMPRESSION: Normal  gastric emptying study. Electronically Signed   By: JDahlia BailiffM.D.   On: 09/20/2022 14:53   CT ABDOMEN PELVIS W CONTRAST  Result Date: 08/26/2022 CLINICAL DATA:  Abdominal pain.  Constipation. EXAM: CT ABDOMEN AND PELVIS WITH CONTRAST TECHNIQUE: Multidetector CT imaging of the abdomen and pelvis was performed using the standard protocol following bolus administration of intravenous contrast. RADIATION DOSE REDUCTION: This exam was performed according to the departmental dose-optimization program which includes automated exposure control,  adjustment of the mA and/or kV according to patient size and/or use of iterative reconstruction technique.  CONTRAST:  117m ISOVUE-300 IOPAMIDOL (ISOVUE-300) INJECTION 61% COMPARISON:  CT abdomen pelvis 10/06/2020. FINDINGS: Lower chest: Normal heart size.  Lung bases are clear. Hepatobiliary: The liver is normal in size and contour. Gallbladder is unremarkable. No intrahepatic or extrahepatic biliary ductal dilatation. Pancreas: Unremarkable. No pancreatic ductal dilatation or surrounding inflammatory changes. Spleen: Normal in size without focal abnormality. Adrenals/Urinary Tract: Normal adrenal glands. Kidneys enhance symmetrically with contrast. No hydronephrosis. Urinary bladder is. Stomach/Bowel: Oral contrast material within the stomach and small bowel. No abnormal bowel wall thickening or evidence for bowel obstruction. No free fluid or free intraperitoneal air. Vascular/Lymphatic: No significant vascular findings are present. No enlarged abdominal or pelvic lymph nodes. Multiple upper abdominal collateral vessels. Non-opacification of the central aspect of the left ovarian vein image 44; series 2. Reproductive: Prior hysterectomy.  No pelvic masses. Other: None Musculoskeletal: No aggressive or acute appearing osseous lesions IMPRESSION: Non opacification of the central aspect of the left ovarian vein (image 42; series 2), may be secondary to mixing artifact. Possibility of nonocclusive ovarian vein thrombosis is not excluded. No acute process within the abdomen or pelvis. Electronically Signed   By: DLovey NewcomerM.D.   On: 08/26/2022 20:35    Discussed with Dr. GLindi Adie(Hematology) over the phone, he recommended that she needs to initiate anticoagulation treatment.  Given the possible complications that could arise and the increased morbidity it carries on the elderly population, clinicians should have a high level of suspicion and should initiate therapy as soon as the diagnosis is confirmed to avoid any catastrophic sequelae (pulmonary embolus, sepsis, death have been reported in some case reports). Dr.  GLindi Adierecommended Xarelto 15 mg po bid x 3 weeks, and 20 mg po qd x 6 months. Follow up with Hematology to be arranged by their office.  Appreciate the recommendations by Dr. GLindi Adie Patient will be called and contacted with these recommendations, and will follow up with Hematology as recommended. No follow up is needed with Gynecology, unless there are other GYN concerns.    UVerita Schneiders MD, FCountry Life Acresfor WDe[employer] [place]

## 2022-11-03 NOTE — Telephone Encounter (Signed)
Pt no showed her appointment in our office today. Contacted pt to advise of provider recommendations and to advise that hematology will f/u with her.  No answer, left vm

## 2022-11-03 NOTE — Progress Notes (Deleted)
Patient did not show up today for her scheduled appointment.  However, patient's chart was reviewed.  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  No signs of any malignancy or other concerns seen on imaging.  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 EXAM: MRI PELVIS WITHOUT AND WITH CONTRAST TECHNIQUE: Multiplanar multisequence MR imaging of the pelvis was performed both before and after administration of intravenous contrast. CONTRAST:  7.5 mL Vueway COMPARISON:  CT of the abdomen and pelvis from August 26, 2022 FINDINGS: Urinary Tract: Urinary bladder is under distended. No signs of distal ureteral dilation or perivesical stranding. Bowel: Mildly distended ileal loops in the lower abdomen without signs of adjacent inflammation. Vascular/Lymphatic: Signs of LEFT ovarian venous thrombosis that were suspected previously are not well assessed on the current study. There is slight increased signal at the upper margin of the visualized LEFT ovarian vein (image 1/9) there is also persistent central low signal on postcontrast images (image 1/14) this is present in the LEFT ovarian vein. No stranding about the LEFT ovary. No edema within the ovarian stroma. No signs of pelvic lymphadenopathy Reproductive: Post hysterectomy with normal appearance of bilateral ovaries. Other:  No ascites Musculoskeletal: No suspicious bone lesions identified. IMPRESSION: 1. Signs of LEFT ovarian venous thrombosis that were suspected previously are not well assessed on the current study. Findings suggest persistent thrombus within the vein which is only seen at the lower most aspect of the vein on the current study. If there is need for further dedicated assessment  CT or MR venography of the abdomen would better illustrated this finding. 2. No stranding about the LEFT ovary or enlargement of the LEFT ovary to indicate acute ovarian process. 3. No signs of pelvic lymphadenopathy. 4. Post hysterectomy with normal appearance of bilateral ovaries. 5. Mildly distended ileal loops in the lower abdomen without signs of adjacent inflammation. Correlate with any signs of delayed enteric transit or GI symptoms. Electronically Signed   By: Zetta Bills M.D.   On: 10/15/2022 11:12   NM GASTRIC EMPTYING  Result Date: 09/20/2022 CLINICAL DATA:  One year of abdominal pain, history of diabetes concern for gastroparesis. EXAM: NUCLEAR MEDICINE GASTRIC EMPTYING SCAN TECHNIQUE: After oral ingestion of radiolabeled meal, sequential abdominal images were obtained for 3 hours. Percentage of activity emptying the stomach was calculated at 1 hour, 2 hour, and 3 hours. RADIOPHARMACEUTICALS:  1.9 mCi Tc-77msulfur colloid in standardized meal COMPARISON:  CT August 26, 2022 FINDINGS: Expected location of the stomach in the left upper quadrant. Ingested meal empties the stomach gradually over the course of the study. 50% emptied at 1 hr ( normal >= 10%) 77% emptied at 2 hr ( normal >= 40%) 93% emptied at 3 hr ( normal >= 70%) IMPRESSION: Normal  gastric emptying study. Electronically Signed   By: JDahlia BailiffM.D.   On: 09/20/2022 14:53   CT ABDOMEN PELVIS W CONTRAST  Result Date: 08/26/2022 CLINICAL DATA:  Abdominal pain.  Constipation. EXAM: CT ABDOMEN AND PELVIS WITH CONTRAST TECHNIQUE: Multidetector CT imaging of the abdomen and pelvis was performed using the standard protocol following bolus administration of intravenous contrast. RADIATION DOSE REDUCTION: This exam was performed according to the departmental dose-optimization program which includes automated exposure control,  adjustment of the mA and/or kV according to patient size and/or use of iterative reconstruction technique.  CONTRAST:  175m ISOVUE-300 IOPAMIDOL (ISOVUE-300) INJECTION 61% COMPARISON:  CT abdomen pelvis 10/06/2020. FINDINGS: Lower chest: Normal heart size.  Lung bases are clear. Hepatobiliary: The liver is normal in size and contour. Gallbladder is unremarkable. No intrahepatic or extrahepatic biliary ductal dilatation. Pancreas: Unremarkable. No pancreatic ductal dilatation or surrounding inflammatory changes. Spleen: Normal in size without focal abnormality. Adrenals/Urinary Tract: Normal adrenal glands. Kidneys enhance symmetrically with contrast. No hydronephrosis. Urinary bladder is. Stomach/Bowel: Oral contrast material within the stomach and small bowel. No abnormal bowel wall thickening or evidence for bowel obstruction. No free fluid or free intraperitoneal air. Vascular/Lymphatic: No significant vascular findings are present. No enlarged abdominal or pelvic lymph nodes. Multiple upper abdominal collateral vessels. Non-opacification of the central aspect of the left ovarian vein image 44; series 2. Reproductive: Prior hysterectomy.  No pelvic masses. Other: None Musculoskeletal: No aggressive or acute appearing osseous lesions IMPRESSION: Non opacification of the central aspect of the left ovarian vein (image 42; series 2), may be secondary to mixing artifact. Possibility of nonocclusive ovarian vein thrombosis is not excluded. No acute process within the abdomen or pelvis. Electronically Signed   By: DLovey NewcomerM.D.   On: 08/26/2022 20:35    Discussed with Dr. GLindi Adie(Hematology) over the phone, he recommended that she needs to initiate anticoagulation treatment.  Given the possible complications that could arise and the increased morbidity it carries on the elderly population, clinicians should have a high level of suspicion and should initiate therapy as soon as the diagnosis is confirmed to avoid any catastrophic sequelae (pulmonary embolus, sepsis, death have been reported in some case reports). Dr.  GLindi Adierecommended Xarelto 15 mg po bid x 3 weeks, and 20 mg po qd x 6 months. Follow up with Hematology to be arranged by their office.  Appreciate the recommendations by Dr. GLindi Adie Patient will be called and contacted with these recommendations, and will follow up with Hematology as recommended. No follow up is needed with Gynecology, unless there are other GYN concerns.    UVerita Schneiders MD, FMcConnell AFBfor [employer]De[employer] [place]

## 2022-11-04 ENCOUNTER — Telehealth: Payer: Self-pay | Admitting: Internal Medicine

## 2022-11-04 NOTE — Telephone Encounter (Signed)
Scheduled appointment per 11/15 secure chat. Left voicemail.

## 2022-11-09 ENCOUNTER — Inpatient Hospital Stay: Payer: Medicaid Other | Attending: Internal Medicine | Admitting: Internal Medicine

## 2022-11-19 ENCOUNTER — Ambulatory Visit
Admission: RE | Admit: 2022-11-19 | Discharge: 2022-11-19 | Disposition: A | Discharge status: Home / Self Care | Payer: Medicaid Other | Source: Ambulatory Visit | Attending: Obstetrics and Gynecology | Admitting: Obstetrics and Gynecology

## 2022-11-19 DIAGNOSIS — R102 Pelvic and perineal pain: Secondary | ICD-10-CM

## 2022-11-19 MED ORDER — GADOPICLENOL 0.5 MMOL/ML IV SOLN
7.0000 mL | Freq: Once | INTRAVENOUS | Status: AC | PRN
  Administered 2022-11-19: 7 mL via INTRAVENOUS

## 2022-12-02 ENCOUNTER — Inpatient Hospital Stay: Payer: Medicaid Other | Attending: Internal Medicine | Admitting: Internal Medicine

## 2022-12-03 ENCOUNTER — Other Ambulatory Visit: Payer: Self-pay | Admitting: Anesthesiology

## 2022-12-03 DIAGNOSIS — M5412 Radiculopathy, cervical region: Secondary | ICD-10-CM

## 2022-12-07 ENCOUNTER — Other Ambulatory Visit: Payer: Self-pay | Admitting: Gastroenterology

## 2022-12-17 ENCOUNTER — Telehealth: Payer: Self-pay | Admitting: *Deleted

## 2022-12-17 NOTE — Telephone Encounter (Signed)
Spoke with the patient and explained that the office would call her next with an appt

## 2022-12-25 ENCOUNTER — Other Ambulatory Visit: Payer: Medicaid Other

## 2023-01-12 ENCOUNTER — Ambulatory Visit (HOSPITAL_COMMUNITY)
Admission: RE | Admit: 2023-01-12 | Discharge: 2023-01-12 | Disposition: A | Discharge status: Home / Self Care | Payer: Medicaid Other | Source: Ambulatory Visit | Attending: Gastroenterology | Admitting: Gastroenterology

## 2023-01-12 ENCOUNTER — Encounter (HOSPITAL_COMMUNITY)
Admission: RE | Disposition: A | Discharge status: Home / Self Care | Payer: Self-pay | Source: Ambulatory Visit | Attending: Gastroenterology

## 2023-01-12 DIAGNOSIS — K219 Gastro-esophageal reflux disease without esophagitis: Secondary | ICD-10-CM | POA: Insufficient documentation

## 2023-01-12 DIAGNOSIS — Z539 Procedure and treatment not carried out, unspecified reason: Secondary | ICD-10-CM | POA: Diagnosis present

## 2023-01-12 DIAGNOSIS — K449 Diaphragmatic hernia without obstruction or gangrene: Secondary | ICD-10-CM | POA: Insufficient documentation

## 2023-01-12 SURGERY — INVASIVE LAB ABORTED CASE

## 2023-01-12 MED ORDER — LIDOCAINE VISCOUS HCL 2 % MT SOLN
OROMUCOSAL | Status: AC
  Filled 2023-01-12: qty 15

## 2023-01-12 SURGICAL SUPPLY — 2 items
FACESHIELD LNG OPTICON STERILE (SAFETY) IMPLANT
GLOVE BIO SURGEON STRL SZ8 (GLOVE) ×4 IMPLANT

## 2023-01-12 NOTE — Progress Notes (Signed)
Pt here for a manometry . Patient stated she has been NPO since yesterday. Probe placed per protocol and easily went into place per [facility] computer. As probe was in place, then patient started coughing and vomiting digested food. Patient nodded that she was fine to see if vomiting would end... Patient continued to vomit digested food with no clearing . Probe removed per protocol. Explained to patient that we do need her stomach empty and no vomiting or gagging to complete study. As vomiting still contained food , aborted study. At this time rescheduling for February 7. Will talke with Dr. Alessandra Bevels about above and see if can have patient take liquid for 24hours before test. Will let patient know Dr. Leanna Sato suggestions.

## 2023-01-16 ENCOUNTER — Ambulatory Visit
Admission: RE | Admit: 2023-01-16 | Discharge: 2023-01-16 | Disposition: A | Discharge status: Home / Self Care | Payer: Medicaid Other | Source: Ambulatory Visit | Attending: Anesthesiology | Admitting: Anesthesiology

## 2023-01-16 DIAGNOSIS — M5412 Radiculopathy, cervical region: Secondary | ICD-10-CM

## 2023-01-26 ENCOUNTER — Ambulatory Visit (HOSPITAL_COMMUNITY)
Admission: RE | Admit: 2023-01-26 | Discharge: 2023-01-26 | Disposition: A | Discharge status: Home / Self Care | Payer: Medicaid Other | Attending: Gastroenterology | Admitting: Gastroenterology

## 2023-01-26 ENCOUNTER — Encounter (HOSPITAL_COMMUNITY)
Admission: RE | Disposition: A | Discharge status: Home / Self Care | Payer: Self-pay | Source: Home / Self Care | Attending: Gastroenterology

## 2023-01-26 DIAGNOSIS — Z01818 Encounter for other preprocedural examination: Secondary | ICD-10-CM | POA: Diagnosis not present

## 2023-01-26 HISTORY — PX: ESOPHAGEAL MANOMETRY: SHX5429

## 2023-01-26 SURGERY — MANOMETRY, ESOPHAGUS

## 2023-01-26 MED ORDER — LIDOCAINE VISCOUS HCL 2 % MT SOLN
OROMUCOSAL | Status: AC
  Filled 2023-01-26: qty 15

## 2023-01-26 SURGICAL SUPPLY — 2 items
FACESHIELD LNG OPTICON STERILE (SAFETY) IMPLANT
GLOVE BIO SURGEON STRL SZ8 (GLOVE) ×2 IMPLANT

## 2023-01-26 NOTE — Progress Notes (Signed)
Esophageal Manometry done per protocol. Patient tolerated well without distress or complication. Sharyn Creamer RN at bedside for emotional support. 1 spray cetacaine used. No issues.

## 2023-01-29 ENCOUNTER — Encounter (HOSPITAL_COMMUNITY): Payer: Self-pay | Admitting: Gastroenterology

## 2023-02-16 ENCOUNTER — Other Ambulatory Visit: Payer: Self-pay | Admitting: Obstetrics and Gynecology

## 2023-02-16 DIAGNOSIS — N6320 Unspecified lump in the left breast, unspecified quadrant: Secondary | ICD-10-CM

## 2023-03-02 ENCOUNTER — Ambulatory Visit
Admission: RE | Admit: 2023-03-02 | Discharge: 2023-03-02 | Disposition: A | Discharge status: Home / Self Care | Payer: Medicaid Other | Source: Ambulatory Visit | Attending: Obstetrics and Gynecology | Admitting: Obstetrics and Gynecology

## 2023-03-02 DIAGNOSIS — N6320 Unspecified lump in the left breast, unspecified quadrant: Secondary | ICD-10-CM

## 2023-06-28 ENCOUNTER — Other Ambulatory Visit: Payer: Self-pay | Admitting: Rheumatology

## 2023-06-28 DIAGNOSIS — Z01810 Encounter for preprocedural cardiovascular examination: Secondary | ICD-10-CM

## 2023-06-30 ENCOUNTER — Ambulatory Visit: Payer: Medicaid Other

## 2023-06-30 DIAGNOSIS — Z01810 Encounter for preprocedural cardiovascular examination: Secondary | ICD-10-CM

## 2023-10-14 ENCOUNTER — Other Ambulatory Visit: Payer: Self-pay

## 2023-10-14 ENCOUNTER — Encounter (HOSPITAL_BASED_OUTPATIENT_CLINIC_OR_DEPARTMENT_OTHER): Payer: Self-pay | Admitting: *Deleted

## 2023-10-14 ENCOUNTER — Emergency Department (HOSPITAL_BASED_OUTPATIENT_CLINIC_OR_DEPARTMENT_OTHER): Payer: Medicaid Other

## 2023-10-14 ENCOUNTER — Emergency Department (HOSPITAL_BASED_OUTPATIENT_CLINIC_OR_DEPARTMENT_OTHER)
Admission: EM | Admit: 2023-10-14 | Discharge: 2023-10-14 | Disposition: A | Discharge status: Home / Self Care | Payer: Medicaid Other | Attending: Emergency Medicine | Admitting: Emergency Medicine

## 2023-10-14 DIAGNOSIS — I10 Essential (primary) hypertension: Secondary | ICD-10-CM | POA: Diagnosis not present

## 2023-10-14 DIAGNOSIS — K529 Noninfective gastroenteritis and colitis, unspecified: Secondary | ICD-10-CM | POA: Insufficient documentation

## 2023-10-14 DIAGNOSIS — Z7901 Long term (current) use of anticoagulants: Secondary | ICD-10-CM | POA: Diagnosis not present

## 2023-10-14 DIAGNOSIS — Z79899 Other long term (current) drug therapy: Secondary | ICD-10-CM | POA: Diagnosis not present

## 2023-10-14 DIAGNOSIS — R1031 Right lower quadrant pain: Secondary | ICD-10-CM

## 2023-10-14 DIAGNOSIS — Z794 Long term (current) use of insulin: Secondary | ICD-10-CM | POA: Diagnosis not present

## 2023-10-14 DIAGNOSIS — I82891 Chronic embolism and thrombosis of other specified veins: Secondary | ICD-10-CM | POA: Diagnosis not present

## 2023-10-14 DIAGNOSIS — R109 Unspecified abdominal pain: Secondary | ICD-10-CM | POA: Diagnosis present

## 2023-10-14 LAB — COMPREHENSIVE METABOLIC PANEL
ALT: 20 U/L (ref 0–44)
AST: 26 U/L (ref 15–41)
Albumin: 4.9 g/dL (ref 3.5–5.0)
Alkaline Phosphatase: 72 U/L (ref 38–126)
Anion gap: 10 (ref 5–15)
BUN: 21 mg/dL — ABNORMAL HIGH (ref 6–20)
CO2: 29 mmol/L (ref 22–32)
Calcium: 10 mg/dL (ref 8.9–10.3)
Chloride: 100 mmol/L (ref 98–111)
Creatinine, Ser: 0.68 mg/dL (ref 0.44–1.00)
GFR, Estimated: >60 mL/min (ref >60)
Glucose, Bld: 96 mg/dL (ref 70–99)
Potassium: 3.4 mmol/L — ABNORMAL LOW (ref 3.5–5.1)
Sodium: 139 mmol/L (ref 135–145)
Total Bilirubin: 0.6 mg/dL (ref 0.3–1.2)
Total Protein: 8.5 g/dL — ABNORMAL HIGH (ref 6.5–8.1)

## 2023-10-14 LAB — URINALYSIS, ROUTINE W REFLEX MICROSCOPIC
Bilirubin Urine: NEGATIVE
Glucose, UA: NEGATIVE mg/dL
Ketones, ur: NEGATIVE mg/dL
Leukocytes,Ua: NEGATIVE
Nitrite: NEGATIVE
Specific Gravity, Urine: 1.024 (ref 1.005–1.030)
pH: 6 (ref 5.0–8.0)

## 2023-10-14 LAB — CBC
HCT: 37.6 % (ref 36.0–46.0)
Hemoglobin: 13.3 g/dL (ref 12.0–15.0)
MCH: 31.1 pg (ref 26.0–34.0)
MCHC: 35.4 g/dL (ref 30.0–36.0)
MCV: 87.9 fL (ref 80.0–100.0)
Platelets: 302 10*3/uL (ref 150–400)
RBC: 4.28 MIL/uL (ref 3.87–5.11)
RDW: 12.4 % (ref 11.5–15.5)
WBC: 5.4 10*3/uL (ref 4.0–10.5)
nRBC: 0 % (ref 0.0–0.2)

## 2023-10-14 LAB — LIPASE, BLOOD: Lipase: 48 U/L (ref 11–51)

## 2023-10-14 MED ORDER — DICYCLOMINE HCL 20 MG PO TABS: 20.0000 mg | ORAL_TABLET | Freq: Two times a day (BID) | ORAL | 0 refills | Status: AC

## 2023-10-14 MED ORDER — ONDANSETRON 4 MG PO TBDP: 4.0000 mg | ORAL_TABLET | Freq: Three times a day (TID) | ORAL | 0 refills | Status: AC | PRN

## 2023-10-14 MED ORDER — IOHEXOL 300 MG/ML  SOLN
100.0000 mL | Freq: Once | INTRAMUSCULAR | Status: AC | PRN
  Administered 2023-10-14: 100 mL via INTRAVENOUS

## 2023-10-14 MED ORDER — ONDANSETRON HCL 4 MG/2ML IJ SOLN
4.0000 mg | Freq: Once | INTRAMUSCULAR | Status: AC
  Administered 2023-10-14: 4 mg via INTRAVENOUS
  Filled 2023-10-14: qty 2

## 2023-10-14 MED ORDER — APIXABAN (ELIQUIS) VTE STARTER PACK (10MG AND 5MG): ORAL_TABLET | ORAL | 0 refills | Status: AC

## 2023-10-14 MED ORDER — MORPHINE SULFATE (PF) 4 MG/ML IV SOLN
4.0000 mg | Freq: Once | INTRAVENOUS | Status: AC
  Administered 2023-10-14: 4 mg via INTRAVENOUS
  Filled 2023-10-14: qty 1

## 2023-10-14 NOTE — ED Provider Notes (Signed)
[place] EMERGENCY DEPARTMENT AT [facility] Provider Note   CSN: [id number] Arrival date & time: 10/14/23  1209     History {Add pertinent medical, surgical, social history, OB history to HPI:1} Chief Complaint  Patient presents with   Abdominal Pain    Kelly Combs is a 57 y.o. female.  HPI     57 year old female with a history of hypertension, hyperlipidemia, prediabetes,    Ate at [facility] Last night, had 5 bites then started to have nausea, right lower abdominal pain, vomiting started later.  Diarrhea x1.  No urinary symptoms. No fever. No vb/discharge.  10/10 pain, sharp Past Medical History:  Diagnosis Date   Fibroids    Hyperlipidemia    Hypertension    Insomnia    Lumbago with sciatica, left side    Lumbago with sciatica, right side    Morbid obesity (HCC)    Other chronic pain    Prediabetes     Past Surgical History:  Procedure Laterality Date   ABDOMINAL SURGERY     ESOPHAGEAL MANOMETRY N/A 01/26/2023   Procedure: ESOPHAGEAL MANOMETRY (EM);  Surgeon: Kathi Der, MD;  Location: [facility];  Service: Gastroenterology;  Laterality: N/A;    Home Medications Prior to Admission medications   Medication Sig Start Date End Date Taking? Authorizing Provider  albuterol (VENTOLIN HFA) 108 (90 Base) MCG/ACT inhaler Inhale 1-2 puffs into the lungs every 6 (six) hours as needed for wheezing or shortness of breath. 07/31/19   Wallis Bamberg, PA-C  atorvastatin (LIPITOR) 10 MG tablet Take 10 mg by mouth daily.    [provider]  Liraglutide -Weight Management (SAXENDA) 18 MG/3ML SOPN Inject into the skin. Patient not taking: Reported on 02/01/2022    [provider]  losartan-hydrochlorothiazide (HYZAAR) 50-12.5 MG tablet Take 1 tablet by mouth daily.    [provider]  lubiprostone (AMITIZA) 24 MCG capsule Take 24 mcg by mouth 2 (two) times daily. Patient not taking: Reported on 02/01/2022 10/03/20   [provider]  metoprolol tartrate (LOPRESSOR) 100 MG tablet Take 1 tablet by mouth 2 hours prior to Cardiac CT Patient not taking: Reported on 02/01/2022 09/15/20   Christell Constant, MD  oxyCODONE-acetaminophen (PERCOCET) 10-325 MG tablet Take 1 tablet by mouth every 4 (four) hours as needed for pain.    [provider]  zolpidem (AMBIEN) 10 MG tablet Take 10 mg by mouth at bedtime as needed for sleep.    [provider]      Allergies    Patient has no known allergies.    Review of Systems   Review of Systems  Physical Exam Updated Vital Signs BP (!) 156/102 (BP Location: Right Arm)   Pulse 94   Temp 99 F (37.2 C)   Resp 18   SpO2 100%  Physical Exam  ED Results / Procedures / Treatments   Labs (all labs ordered are listed, but only abnormal results are displayed) Labs Reviewed  LIPASE, BLOOD  COMPREHENSIVE METABOLIC PANEL  CBC  URINALYSIS, ROUTINE W REFLEX MICROSCOPIC    EKG None  Radiology No results found.  Procedures Procedures  {Document cardiac monitor, telemetry assessment procedure when appropriate:1}  Medications Ordered in ED Medications - No data to display  ED Course/ Medical Decision Making/ A&P   {   Click here for ABCD2, HEART and other calculatorsREFRESH Note before signing :1}  Medical Decision Making Amount and/or Complexity of Data Reviewed Labs: ordered. Radiology: ordered.  Risk Prescription drug management.   ***  {Document critical care time when appropriate:1} {Document review of labs and clinical decision tools ie heart score, Chads2Vasc2 etc:1}  {Document your independent review of radiology images, and any outside records:1} {Document your discussion with family members, caretakers, and with consultants:1} {Document social determinants of health affecting pt's care:1} {Document your decision making why or why not admission, treatments were needed:1} Final Clinical Impression(s) /  ED Diagnoses Final diagnoses:  None    Rx / DC Orders ED Discharge Orders     None

## 2023-10-14 NOTE — ED Triage Notes (Signed)
Pt woke up this am with RLQ abdominal pain that has been associated with nausea and vomiting (x2) and one episode of diarrhea.

## 2023-10-31 ENCOUNTER — Other Ambulatory Visit: Payer: Self-pay

## 2023-10-31 ENCOUNTER — Ambulatory Visit: Payer: Medicaid Other | Attending: Anesthesiology

## 2023-10-31 DIAGNOSIS — M5459 Other low back pain: Secondary | ICD-10-CM | POA: Diagnosis present

## 2023-10-31 DIAGNOSIS — M6281 Muscle weakness (generalized): Secondary | ICD-10-CM | POA: Diagnosis present

## 2023-10-31 DIAGNOSIS — R2689 Other abnormalities of gait and mobility: Secondary | ICD-10-CM | POA: Diagnosis present

## 2023-10-31 NOTE — Therapy (Signed)
OUTPATIENT PHYSICAL THERAPY THORACOLUMBAR EVALUATION   Patient Name: Kelly Combs MRN: [id number] DOB:05/22/1966, 57 y.o., female Today's Date: 10/31/2023  END OF SESSION:  PT End of Session - 10/31/23 0842     Visit Number 1    Number of Visits 17    Date for PT Re-Evaluation 12/26/23    Authorization Type [employer]    PT Start Time 0755    PT Stop Time 0835    PT Time Calculation (min) 40 min    Activity Tolerance Patient tolerated treatment well    Behavior During Therapy WFL for tasks assessed/performed             Past Medical History:  Diagnosis Date   Fibroids    Hyperlipidemia    Hypertension    Insomnia    Lumbago with sciatica, left side    Lumbago with sciatica, right side    Morbid obesity (HCC)    Other chronic pain    Prediabetes    Past Surgical History:  Procedure Laterality Date   ABDOMINAL SURGERY     ESOPHAGEAL MANOMETRY N/A 01/26/2023   Procedure: ESOPHAGEAL MANOMETRY (EM);  Surgeon: Kathi Der, MD;  Location: [facility];  Service: Gastroenterology;  Laterality: N/A;   Patient Active Problem List   Diagnosis Date Noted   Precordial pain 09/15/2020   Hyperlipidemia 09/15/2020   Shortness of breath 09/15/2020   Essential hypertension 09/15/2020    PCP: Deatra James, MD  REFERRING PROVIDER: Rhona Raider, DO   REFERRING DIAG: [phone] - Radiculopathy, lumbar region  Rationale for Evaluation and Treatment: Rehabilitation  THERAPY DIAG:  Other low back pain  Muscle weakness (generalized)  Other abnormalities of gait and mobility  ONSET DATE: Chronic  SUBJECTIVE:                                                                                                                                                                                           SUBJECTIVE STATEMENT: Pt presents to PT with reports of chronic LBP with referral of numbness and cramping in bilateral LE. Notes that pain is increased in the morning and with  prolonged standing. Denies bowel/bladder changes or saddle anesthesia. Is frustrated by her inability to work out for weight loss. No trauma or MOI, pain has been gradually getting worse.   PERTINENT HISTORY:  HTN  PAIN:  Are you having pain?  Yes: NPRS scale: 8/10 Worst: 10/10 Pain location: lower back, bilateral LE Pain description: sharp, cramping Aggravating factors: walking, prolonged standing Relieving factors: medication, heat  PRECAUTIONS: None  RED FLAGS: None   WEIGHT BEARING RESTRICTIONS: No  FALLS:  Has patient  fallen in last 6 months? No  LIVING ENVIRONMENT: Lives with: lives with their family Lives in: House/apartment Stairs: Yes: Internal: 12 steps; on right going up Has following equipment at home: None  OCCUPATION: not working currently  PLOF: Independent  PATIENT GOALS: decrease back pain, get back to going to the gym and do more community activities  NEXT MD VISIT: unsure  OBJECTIVE:  Note: Objective measures were completed at Evaluation unless otherwise noted.  DIAGNOSTIC FINDINGS:  N/A  PATIENT SURVEYS:  FOTO: 47% function; 51% predicted  COGNITION: Overall cognitive status: Within functional limits for tasks assessed     SENSATION: WFL  MUSCLE LENGTH: Hamstrings: Right WF deg; Left WFL deg Thomas test: Right (+); Left (+)  POSTURE: rounded shoulders, forward head, and increased lumbar lordosis  PALPATION: TTP to bilateral lumbar paraspinals   LOWER EXTREMITY MMT:    MMT Right eval Left eval  Hip flexion 3+/5 3+/5  Hip extension 3+/5 3+/5  Hip abduction 3+/5 3+/5  Hip adduction    Hip internal rotation    Hip external rotation    Knee flexion 4/5 4/5  Knee extension 4/5 4/5  Ankle dorsiflexion    Ankle plantarflexion    Ankle inversion    Ankle eversion     (Blank rows = not tested)  LUMBAR SPECIAL TESTS:  Straight leg raise test: Positive and Slump test: Negative  FUNCTIONAL TESTS:  Five Time Sit to Stand: 17  seconds with UE  GAIT: Distance walked: 89ft Assistive device utilized: None Level of assistance: Complete Independence Comments: trunk flexed  TREATMENT: [facility] Adult PT Treatment:                                                DATE: 10/31/2023 Therapeutic Exercise: Row x 10 GTB Supine PPT x 5 - 5" hold Modified thomas stretch x 60" R Seated clamshell GTB x 15   PATIENT EDUCATION:  Education details: eval findings, FOTO, HEP, POC Person educated: Patient Education method: Explanation, Demonstration, and Handouts Education comprehension: verbalized understanding and returned demonstration  HOME EXERCISE PROGRAM: Access Code: [email or web address] URL: [email or web address] Date: 10/31/2023 Prepared by: Edwinna Areola  Exercises - Standing Shoulder Row with Anchored Resistance  - 1 x daily - 7 x weekly - 3 sets - 10 reps - green band hold - Supine Posterior Pelvic Tilt  - 1 x daily - 7 x weekly - 2 sets - 10 reps - 3 sec hold - Modified Thomas Stretch  - 1 x daily - 7 x weekly - 2 reps - 60 sec hold - Seated Hip Abduction with Resistance  - 1 x daily - 7 x weekly - 3 sets - 15 reps - blue band hold  ASSESSMENT:  CLINICAL IMPRESSION: Patient is a 57 y.o. F who was seen today for physical therapy evaluation and treatment for chronic LBP that refers into bilateral LE. Physical findings are consistent with referring physician impression as pt demonstrates hip weakness and decrease in functional mobility. FOTO score shows decrease in subjective functional ability below PLOF. Pt would benefit from skilled PT services working on improving core/hip strength and mobility in order to decrease pain and improve comfort.   OBJECTIVE IMPAIRMENTS: Abnormal gait, decreased activity tolerance, decreased endurance, decreased mobility, difficulty walking, decreased ROM, decreased strength, postural dysfunction, and pain.   ACTIVITY LIMITATIONS: carrying, lifting, sitting, standing, squatting,  stairs,  transfers, and locomotion level  PARTICIPATION LIMITATIONS: meal prep, cleaning, driving, shopping, community activity, and yard work  PERSONAL FACTORS: Time since onset of injury/illness/exacerbation and 1-2 comorbidities: HTN  are also affecting patient's functional outcome.   REHAB POTENTIAL: Excellent  CLINICAL DECISION MAKING: Stable/uncomplicated  EVALUATION COMPLEXITY: Low   GOALS: Goals reviewed with patient? No  SHORT TERM GOALS: Target date: 11/21/2023   Pt will be compliant and knowledgeable with initial HEP for improved comfort and carryover Baseline: initial HEP given  Goal status: INITIAL  2.  Pt will self report lower back and LE pain no greater than 6/10 for improved comfort and functional ability Baseline: 10/10 at worst Goal status: INITIAL   LONG TERM GOALS: Target date: 12/26/2023   Pt will improve FOTO function score to no less than 51% as proxy for functional improvement Baseline: 47% function Goal status: INITIAL   2.  Pt will self report low back pain no greater than 3/10 for improved comfort and functional ability Baseline: 10/10 at worst Goal status: INITIAL   3.  Pt will decrease Five Time Sit to Stand time to no less than 11 seconds for improved balance, strength, and functional mobility Baseline: 17 seconds with UE Goal status: INITIAL    4.  Pt will improve bilateral hip MMT to no less than 4/5 for improved strength and mobility while decreasing LBP Baseline: see MMT chart Goal status: INITIAL  5.  Pt will be able to get back to the gym 3x/wk with limitation secondary to LBP for return to desired level of community activity Baseline: unable Goal status: INITIAL  PLAN:  PT FREQUENCY: 1-2x/week  PT DURATION: 8 weeks  PLANNED INTERVENTIONS: 97164- PT Re-evaluation, 97110-Therapeutic exercises, 97530- Therapeutic activity, 97112- Neuromuscular re-education, 97535- Self Care, 75643- Manual therapy, [id number]- Gait training, [id number]-  Aquatic Therapy, 97014- Electrical stimulation (unattended), [id number]- Electrical stimulation (manual), 97016- Vasopneumatic device, Dry Needling, Cryotherapy, and Moist heat  PLAN FOR NEXT SESSION: assess HEP response, core/hip strengthening, hip flexor stretching, aquatics   Eloy End, PT 10/31/2023, 8:43 AM

## 2023-11-10 ENCOUNTER — Ambulatory Visit: Payer: Medicaid Other

## 2023-11-10 DIAGNOSIS — M5459 Other low back pain: Secondary | ICD-10-CM

## 2023-11-10 DIAGNOSIS — R2689 Other abnormalities of gait and mobility: Secondary | ICD-10-CM

## 2023-11-10 DIAGNOSIS — M6281 Muscle weakness (generalized): Secondary | ICD-10-CM

## 2023-11-10 NOTE — Therapy (Signed)
OUTPATIENT PHYSICAL THERAPY TREATMENT   Patient Name: Kelly Combs MRN: [id number] DOB:1966/04/01, 57 y.o., female Today's Date: 11/10/2023  END OF SESSION:  PT End of Session - 11/10/23 0759     Visit Number 2    Number of Visits 17    Date for PT Re-Evaluation 12/26/23    Authorization Type [employer]    PT Start Time 0800    PT Stop Time 0840    PT Time Calculation (min) 40 min    Activity Tolerance Patient tolerated treatment well    Behavior During Therapy WFL for tasks assessed/performed              Past Medical History:  Diagnosis Date   Fibroids    Hyperlipidemia    Hypertension    Insomnia    Lumbago with sciatica, left side    Lumbago with sciatica, right side    Morbid obesity (HCC)    Other chronic pain    Prediabetes    Past Surgical History:  Procedure Laterality Date   ABDOMINAL SURGERY     ESOPHAGEAL MANOMETRY N/A 01/26/2023   Procedure: ESOPHAGEAL MANOMETRY (EM);  Surgeon: Kathi Der, MD;  Location: [facility];  Service: Gastroenterology;  Laterality: N/A;   Patient Active Problem List   Diagnosis Date Noted   Precordial pain 09/15/2020   Hyperlipidemia 09/15/2020   Shortness of breath 09/15/2020   Essential hypertension 09/15/2020    PCP: Deatra James, MD  REFERRING PROVIDER: Christella Hartigan I, DO   REFERRING DIAG: M54.16 - Radiculopathy, lumbar region  Rationale for Evaluation and Treatment: Rehabilitation  THERAPY DIAG:  Other low back pain  Muscle weakness (generalized)  Other abnormalities of gait and mobility  ONSET DATE: Chronic  SUBJECTIVE:                                                                                                                                                                                           SUBJECTIVE STATEMENT: Pt presents to PT with reports of continued back pain. Has been compliant with HEP with no adverse effect.   EVAL: Pt presents to PT with reports of chronic LBP with  referral of numbness and cramping in bilateral LE. Notes that pain is increased in the morning and with prolonged standing. Denies bowel/bladder changes or saddle anesthesia. Is frustrated by her inability to work out for weight loss. No trauma or MOI, pain has been gradually getting worse.   PERTINENT HISTORY:  HTN  PAIN:  Are you having pain?  Yes: NPRS scale: 7/10 Worst: 10/10 Pain location: lower back, bilateral LE Pain description: sharp, cramping Aggravating factors: walking, prolonged standing  Relieving factors: medication, heat  PRECAUTIONS: None  RED FLAGS: None   WEIGHT BEARING RESTRICTIONS: No  FALLS:  Has patient fallen in last 6 months? No  LIVING ENVIRONMENT: Lives with: lives with their family Lives in: House/apartment Stairs: Yes: Internal: 12 steps; on right going up Has following equipment at home: None  OCCUPATION: not working currently  PLOF: Independent  PATIENT GOALS: decrease back pain, get back to going to the gym and do more community activities  NEXT MD VISIT: unsure  OBJECTIVE:  Note: Objective measures were completed at Evaluation unless otherwise noted.  DIAGNOSTIC FINDINGS:  N/A  PATIENT SURVEYS:  FOTO: 47% function; 51% predicted  COGNITION: Overall cognitive status: Within functional limits for tasks assessed     SENSATION: WFL  MUSCLE LENGTH: Hamstrings: Right WF deg; Left WFL deg Thomas test: Right (+); Left (+)  POSTURE: rounded shoulders, forward head, and increased lumbar lordosis  PALPATION: TTP to bilateral lumbar paraspinals   LOWER EXTREMITY MMT:    MMT Right eval Left eval  Hip flexion 3+/5 3+/5  Hip extension 3+/5 3+/5  Hip abduction 3+/5 3+/5  Hip adduction    Hip internal rotation    Hip external rotation    Knee flexion 4/5 4/5  Knee extension 4/5 4/5  Ankle dorsiflexion    Ankle plantarflexion    Ankle inversion    Ankle eversion     (Blank rows = not tested)  LUMBAR SPECIAL TESTS:   Straight leg raise test: Positive and Slump test: Negative  FUNCTIONAL TESTS:  Five Time Sit to Stand: 17 seconds with UE  GAIT: Distance walked: 29ft Assistive device utilized: None Level of assistance: Complete Independence Comments: trunk flexed  TREATMENT: [facility] Adult PT Treatment:                                                DATE: 11/10/2023 Therapeutic Exercise: NuStep lvl 4 UE/LE x 4 min while taking subjective Supine PPT x 10 - 5" hold Supine PPT with ball 2x10 Supine clamshell 2x15 GTB Supine pilates SLR 2x10 each Supine horizontal abd 2x10 GTB Row 2x10 GTB Pallof press x 10 GTB Shoulder ext with abd press 2x10  [facility] Adult PT Treatment:                                                DATE: 10/31/2023 Therapeutic Exercise: Row x 10 GTB Supine PPT x 5 - 5" hold Modified thomas stretch x 60" R Seated clamshell GTB x 15   PATIENT EDUCATION:  Education details: continue HEP Person educated: Patient Education method: Explanation, Demonstration, and Handouts Education comprehension: verbalized understanding and returned demonstration  HOME EXERCISE PROGRAM: Access Code: [email or web address] URL: [email or web address] Date: 10/31/2023 Prepared by: Edwinna Areola  Exercises - Standing Shoulder Row with Anchored Resistance  - 1 x daily - 7 x weekly - 3 sets - 10 reps - green band hold - Supine Posterior Pelvic Tilt  - 1 x daily - 7 x weekly - 2 sets - 10 reps - 3 sec hold - Modified Thomas Stretch  - 1 x daily - 7 x weekly - 2 reps - 60 sec hold - Seated Hip Abduction with Resistance  - 1 x daily - 7  x weekly - 3 sets - 15 reps - blue band hold  ASSESSMENT:  CLINICAL IMPRESSION: Pt was able to complete all prescribed exercises with no adverse effect. Therapy focused on improving core/hip strength and decreasing pain. She is progressing as expected with therapy, will continue per POC.   EVAL: Patient is a 57 y.o. F who was seen today for physical therapy  evaluation and treatment for chronic LBP that refers into bilateral LE. Physical findings are consistent with referring physician impression as pt demonstrates hip weakness and decrease in functional mobility. FOTO score shows decrease in subjective functional ability below PLOF. Pt would benefit from skilled PT services working on improving core/hip strength and mobility in order to decrease pain and improve comfort.   OBJECTIVE IMPAIRMENTS: Abnormal gait, decreased activity tolerance, decreased endurance, decreased mobility, difficulty walking, decreased ROM, decreased strength, postural dysfunction, and pain.   ACTIVITY LIMITATIONS: carrying, lifting, sitting, standing, squatting, stairs, transfers, and locomotion level  PARTICIPATION LIMITATIONS: meal prep, cleaning, driving, shopping, community activity, and yard work  PERSONAL FACTORS: Time since onset of injury/illness/exacerbation and 1-2 comorbidities: HTN  are also affecting patient's functional outcome.   REHAB POTENTIAL: Excellent  CLINICAL DECISION MAKING: Stable/uncomplicated  EVALUATION COMPLEXITY: Low   GOALS: Goals reviewed with patient? No  SHORT TERM GOALS: Target date: 11/21/2023   Pt will be compliant and knowledgeable with initial HEP for improved comfort and carryover Baseline: initial HEP given  Goal status: INITIAL  2.  Pt will self report lower back and LE pain no greater than 6/10 for improved comfort and functional ability Baseline: 10/10 at worst Goal status: INITIAL   LONG TERM GOALS: Target date: 12/26/2023   Pt will improve FOTO function score to no less than 51% as proxy for functional improvement Baseline: 47% function Goal status: INITIAL   2.  Pt will self report low back pain no greater than 3/10 for improved comfort and functional ability Baseline: 10/10 at worst Goal status: INITIAL   3.  Pt will decrease Five Time Sit to Stand time to no less than 11 seconds for improved balance, strength,  and functional mobility Baseline: 17 seconds with UE Goal status: INITIAL    4.  Pt will improve bilateral hip MMT to no less than 4/5 for improved strength and mobility while decreasing LBP Baseline: see MMT chart Goal status: INITIAL  5.  Pt will be able to get back to the gym 3x/wk with limitation secondary to LBP for return to desired level of community activity Baseline: unable Goal status: INITIAL  PLAN:  PT FREQUENCY: 1-2x/week  PT DURATION: 8 weeks  PLANNED INTERVENTIONS: 97164- PT Re-evaluation, 97110-Therapeutic exercises, 97530- Therapeutic activity, 97112- Neuromuscular re-education, 97535- Self Care, 16109- Manual therapy, [id number]- Gait training, [id number]- Aquatic Therapy, 97014- Electrical stimulation (unattended), [id number]- Electrical stimulation (manual), 97016- Vasopneumatic device, Dry Needling, Cryotherapy, and Moist heat  PLAN FOR NEXT SESSION: assess HEP response, core/hip strengthening, hip flexor stretching, aquatics   Eloy End, PT 11/10/2023, 8:41 AM

## 2023-12-06 ENCOUNTER — Ambulatory Visit: Payer: Medicaid Other

## 2023-12-23 ENCOUNTER — Encounter (HOSPITAL_BASED_OUTPATIENT_CLINIC_OR_DEPARTMENT_OTHER): Payer: Self-pay | Admitting: Emergency Medicine

## 2023-12-23 ENCOUNTER — Emergency Department (HOSPITAL_BASED_OUTPATIENT_CLINIC_OR_DEPARTMENT_OTHER)
Admission: EM | Admit: 2023-12-23 | Discharge: 2023-12-23 | Discharge status: Against Medical Advice | Payer: Medicaid Other | Attending: Emergency Medicine | Admitting: Emergency Medicine

## 2023-12-23 ENCOUNTER — Other Ambulatory Visit: Payer: Self-pay

## 2023-12-23 DIAGNOSIS — Z5321 Procedure and treatment not carried out due to patient leaving prior to being seen by health care provider: Secondary | ICD-10-CM | POA: Insufficient documentation

## 2023-12-23 DIAGNOSIS — R0602 Shortness of breath: Secondary | ICD-10-CM | POA: Insufficient documentation

## 2023-12-23 DIAGNOSIS — M79604 Pain in right leg: Secondary | ICD-10-CM | POA: Diagnosis present

## 2023-12-23 LAB — BASIC METABOLIC PANEL
Anion gap: 10 (ref 5–15)
BUN: 13 mg/dL (ref 6–20)
CO2: 28 mmol/L (ref 22–32)
Calcium: 9.5 mg/dL (ref 8.9–10.3)
Chloride: 103 mmol/L (ref 98–111)
Creatinine, Ser: 0.63 mg/dL (ref 0.44–1.00)
GFR, Estimated: >60 mL/min (ref >60)
Glucose, Bld: 102 mg/dL — ABNORMAL HIGH (ref 70–99)
Potassium: 3.6 mmol/L (ref 3.5–5.1)
Sodium: 141 mmol/L (ref 135–145)

## 2023-12-23 LAB — CBC
HCT: 35.4 % — ABNORMAL LOW (ref 36.0–46.0)
Hemoglobin: 12.4 g/dL (ref 12.0–15.0)
MCH: 30.8 pg (ref 26.0–34.0)
MCHC: 35 g/dL (ref 30.0–36.0)
MCV: 87.8 fL (ref 80.0–100.0)
Platelets: 276 10*3/uL (ref 150–400)
RBC: 4.03 MIL/uL (ref 3.87–5.11)
RDW: 13.1 % (ref 11.5–15.5)
WBC: 4.1 10*3/uL (ref 4.0–10.5)
nRBC: 0 % (ref 0.0–0.2)

## 2023-12-23 LAB — TROPONIN I (HIGH SENSITIVITY): Troponin I (High Sensitivity): 2 ng/L (ref <18)

## 2023-12-23 NOTE — ED Triage Notes (Signed)
 Right leg pain back of thigh Recently on and off BBL surgery 6 months ago  Reports recent blood clot dx and failure to continue blood thinner past 1 month due to delayed f/u  Reports SOB when walking up stairs which is new

## 2023-12-26 ENCOUNTER — Ambulatory Visit: Payer: Medicaid Other | Attending: Anesthesiology

## 2023-12-26 DIAGNOSIS — M6281 Muscle weakness (generalized): Secondary | ICD-10-CM | POA: Insufficient documentation

## 2023-12-26 DIAGNOSIS — M5459 Other low back pain: Secondary | ICD-10-CM | POA: Diagnosis present

## 2023-12-26 NOTE — Therapy (Addendum)
 OUTPATIENT PHYSICAL THERAPY TREATMENT NOTE/DISCHARGE  PHYSICAL THERAPY DISCHARGE SUMMARY  Visits from Start of Care: 3  Current functional level related to goals / functional outcomes: See goals/objective   Remaining deficits: Unable to assess   Education / Equipment: HEP   Patient agrees to discharge. Patient goals were unable to assess. Patient is being discharged due to not returning since the last visit.     Patient Name: Kelly Combs MRN: [id number] DOB:Dec 12, 1966, 58 y.o., female, female Today's Date: 12/26/2023  END OF SESSION:  PT End of Session - 12/26/23 0843     Visit Number 3    Number of Visits 17    Date for PT Re-Evaluation 02/06/24    Authorization Type [employer]    PT Start Time 0844    PT Stop Time 0904    PT Time Calculation (min) 20 min    Activity Tolerance Patient tolerated treatment well    Behavior During Therapy WFL for tasks assessed/performed               Past Medical History:  Diagnosis Date   Fibroids    Hyperlipidemia    Hypertension    Insomnia    Lumbago with sciatica, left side    Lumbago with sciatica, right side    Morbid obesity (HCC)    Other chronic pain    Prediabetes    Past Surgical History:  Procedure Laterality Date   ABDOMINAL SURGERY     bbl     ESOPHAGEAL MANOMETRY N/A 01/26/2023   Procedure: ESOPHAGEAL MANOMETRY (EM);  Surgeon: Elicia Claw, MD;  Location: [facility];  Service: Gastroenterology;  Laterality: N/A;   Patient Active Problem List   Diagnosis Date Noted   Precordial pain 09/15/2020   Hyperlipidemia 09/15/2020   Shortness of breath 09/15/2020   Essential hypertension 09/15/2020    PCP: Sun, Vyvyan, MD  REFERRING PROVIDER: Anda Inocente FERNS, DO   REFERRING DIAG: [phone] - Radiculopathy, lumbar region  Rationale for Evaluation and Treatment: Rehabilitation  THERAPY DIAG:  Other low back pain - Plan: PT plan of care cert/re-cert  Muscle weakness (generalized) - Plan: PT plan of care  cert/re-cert  ONSET DATE: Chronic  SUBJECTIVE:                                                                                                                                                                                           SUBJECTIVE STATEMENT: Pt presents to PT with reports of continued discomfort. She is concerned about a blood clot in her R posterior thigh. She is awaiting a call to have an ultrasound performed to determine if that is the  case. She has not been able to return to gym. Is in a lot of pain today per report. Continues to be a 10/10 at times in last two weeks.   EVAL: Pt presents to PT with reports of chronic LBP with referral of numbness and cramping in bilateral LE. Notes that pain is increased in the morning and with prolonged standing. Denies bowel/bladder changes or saddle anesthesia. Is frustrated by her inability to work out for weight loss. No trauma or MOI, pain has been gradually getting worse.   PERTINENT HISTORY:  HTN  PAIN:  Are you having pain?  Yes: NPRS scale: 8/10 Worst: 10/10 Pain location: lower back, bilateral LE Pain description: sharp, cramping Aggravating factors: walking, prolonged standing Relieving factors: medication, heat  PRECAUTIONS: None  RED FLAGS: None   WEIGHT BEARING RESTRICTIONS: No  FALLS:  Has patient fallen in last 6 months? No  LIVING ENVIRONMENT: Lives with: lives with their family Lives in: House/apartment Stairs: Yes: Internal: 12 steps; on right going up Has following equipment at home: None  OCCUPATION: not working currently  PLOF: Independent  PATIENT GOALS: decrease back pain, get back to going to the gym and do more community activities  NEXT MD VISIT: unsure  OBJECTIVE:  Note: Objective measures were completed at Evaluation unless otherwise noted.  DIAGNOSTIC FINDINGS:  N/A  PATIENT SURVEYS:  FOTO: 47% function; 51% predicted 12/26/2023: 40% function  COGNITION: Overall cognitive status:  Within functional limits for tasks assessed     SENSATION: WFL  MUSCLE LENGTH: Hamstrings: Right WF deg; Left WFL deg Debby test: Right (+); Left (+)  POSTURE: rounded shoulders, forward head, and increased lumbar lordosis  PALPATION: TTP to bilateral lumbar paraspinals   LOWER EXTREMITY MMT:    MMT Right eval Left eval  Hip flexion 3+/5 3+/5  Hip extension 3+/5 3+/5  Hip abduction 3+/5 3+/5  Hip adduction    Hip internal rotation    Hip external rotation    Knee flexion 4/5 4/5  Knee extension 4/5 4/5  Ankle dorsiflexion    Ankle plantarflexion    Ankle inversion    Ankle eversion     (Blank rows = not tested)  LUMBAR SPECIAL TESTS:  Straight leg raise test: Positive and Slump test: Negative  FUNCTIONAL TESTS:  Five Time Sit to Stand: 17 seconds with UE  12/26/2023: 30 seconds with no UE  GAIT: Distance walked: 64ft Assistive device utilized: None Level of assistance: Complete Independence Comments: trunk flexed  TREATMENT: [facility] Adult PT Treatment:                                                DATE: 12/26/2023 Therapeutic Activity: Assessment of tests/measures, goals, and outcomes for recert Palpation to R posterior thigh - no warmth noted, TTP   [facility] Adult PT Treatment:                                                DATE: 11/10/2023 Therapeutic Exercise: NuStep lvl 4 UE/LE x 4 min while taking subjective Supine PPT x 10 - 5 hold Supine PPT with ball 2x10 Supine clamshell 2x15 GTB Supine pilates SLR 2x10 each Supine horizontal abd 2x10 GTB Row 2x10 GTB Pallof press x 10  GTB Shoulder ext with abd press 2x10  [facility] Adult PT Treatment:                                                DATE: 10/31/2023 Therapeutic Exercise: Row x 10 GTB Supine PPT x 5 - 5 hold Modified thomas stretch x 60 R Seated clamshell GTB x 15   PATIENT EDUCATION:  Education details: continue HEP Person educated: Patient Education method: Explanation, Demonstration, and  Handouts Education comprehension: verbalized understanding and returned demonstration  HOME EXERCISE PROGRAM: Access Code: [email or web address] URL: [email or web address] Date: 10/31/2023 Prepared by: Alm Kingdom  Exercises - Standing Shoulder Row with Anchored Resistance  - 1 x daily - 7 x weekly - 3 sets - 10 reps - green band hold - Supine Posterior Pelvic Tilt  - 1 x daily - 7 x weekly - 2 sets - 10 reps - 3 sec hold - Modified Thomas Stretch  - 1 x daily - 7 x weekly - 2 reps - 60 sec hold - Seated Hip Abduction with Resistance  - 1 x daily - 7 x weekly - 3 sets - 15 reps - blue band hold  ASSESSMENT:  CLINICAL IMPRESSION: Patient presented to PT with reports of continued severe pain in lower back and bilateral LE. Today exercises were withheld as she is having a doppler to rule out blood clot in posterior R LE. Goals and outcomes were reviewed with patient demonstrating decrease in functional mobility as well as decrease in subjective functioning assessed via FOTO. Due to missing treatment sessions because of holidays and travel patient was not seen very much through first POC. PT will extend POC but only schedule once she is cleared PCP after doppler to rule out blood clot.   EVAL: Patient is a 58 y.o. F who was seen today for physical therapy evaluation and treatment for chronic LBP that refers into bilateral LE. Physical findings are consistent with referring physician impression as pt demonstrates hip weakness and decrease in functional mobility. FOTO score shows decrease in subjective functional ability below PLOF. Pt would benefit from skilled PT services working on improving core/hip strength and mobility in order to decrease pain and improve comfort.   OBJECTIVE IMPAIRMENTS: Abnormal gait, decreased activity tolerance, decreased endurance, decreased mobility, difficulty walking, decreased ROM, decreased strength, postural dysfunction, and pain.   ACTIVITY LIMITATIONS:  carrying, lifting, sitting, standing, squatting, stairs, transfers, and locomotion level  PARTICIPATION LIMITATIONS: meal prep, cleaning, driving, shopping, community activity, and yard work  PERSONAL FACTORS: Time since onset of injury/illness/exacerbation and 1-2 comorbidities: HTN are also affecting patient's functional outcome.   REHAB POTENTIAL: Excellent  CLINICAL DECISION MAKING: Stable/uncomplicated  EVALUATION COMPLEXITY: Low   GOALS: Goals reviewed with patient? No  SHORT TERM GOALS: Target date: 11/21/2023   Pt will be compliant and knowledgeable with initial HEP for improved comfort and carryover Baseline: initial HEP given  Goal status: MET  2.  Pt will self report lower back and LE pain no greater than 6/10 for improved comfort and functional ability Baseline: 10/10 at worst 12/26/2023: 10/10 Goal status: IN PROGRESS   LONG TERM GOALS: Target date: 02/06/2024    Pt will improve FOTO function score to no less than 51% as proxy for functional improvement Baseline: 47% function 12/26/2023: 40% function Goal status: IN PROGRESS   2.  Pt will self report low  back pain no greater than 3/10 for improved comfort and functional ability Baseline: 10/10 at worst 12/26/2023: 10/10 at worst Goal status: IN PROGRESS   3.  Pt will decrease Five Time Sit to Stand time to no less than 11 seconds for improved balance, strength, and functional mobility Baseline: 17 seconds with UE 12/26/2023: 30 seconds no UE Goal status: IN PROGRESS    4.  Pt will improve bilateral hip MMT to no less than 4/5 for improved strength and mobility while decreasing LBP Baseline: see MMT chart Goal status: IN PROGRESS  5.  Pt will be able to get back to the gym 3x/wk with limitation secondary to LBP for return to desired level of community activity Baseline: unable Goal status: IN PROGRESS   PLAN:  PT FREQUENCY: 1-2x/week  PT DURATION: 6 weeks  PLANNED INTERVENTIONS: 97164- PT Re-evaluation,  97110-Therapeutic exercises, 97530- Therapeutic activity, 97112- Neuromuscular re-education, 97535- Self Care, [id number]- Manual therapy, [id number]- Gait training, [id number]- Aquatic Therapy, 97014- Electrical stimulation (unattended), [id number]- Electrical stimulation (manual), 97016- Vasopneumatic device, Dry Needling, Cryotherapy, and Moist heat  PLAN FOR NEXT SESSION: assess HEP response, core/hip strengthening, hip flexor stretching, aquatics   Alm JAYSON Kingdom, PT 12/26/2023, 9:22 AM

## 2024-11-22 ENCOUNTER — Other Ambulatory Visit: Payer: Self-pay | Admitting: Nurse Practitioner

## 2024-11-22 DIAGNOSIS — M5412 Radiculopathy, cervical region: Secondary | ICD-10-CM

## 2024-11-23 ENCOUNTER — Other Ambulatory Visit: Payer: Self-pay | Admitting: Nurse Practitioner

## 2024-11-23 ENCOUNTER — Ambulatory Visit
Admission: RE | Admit: 2024-11-23 | Discharge: 2024-11-23 | Disposition: A | Source: Ambulatory Visit | Attending: Nurse Practitioner | Admitting: Nurse Practitioner

## 2024-11-23 DIAGNOSIS — M47816 Spondylosis without myelopathy or radiculopathy, lumbar region: Secondary | ICD-10-CM

## 2024-12-07 ENCOUNTER — Encounter (HOSPITAL_COMMUNITY): Payer: Self-pay | Admitting: Gastroenterology

## 2024-12-07 ENCOUNTER — Encounter: Payer: Self-pay | Admitting: Nurse Practitioner

## 2024-12-11 ENCOUNTER — Ambulatory Visit
Admission: RE | Admit: 2024-12-11 | Discharge: 2024-12-11 | Disposition: A | Discharge status: Home / Self Care | Source: Ambulatory Visit | Attending: Nurse Practitioner | Admitting: Nurse Practitioner

## 2024-12-11 DIAGNOSIS — M5412 Radiculopathy, cervical region: Secondary | ICD-10-CM
